# Patient Record
Sex: Male | Born: 1990 | Race: Black or African American | Hispanic: No | Marital: Single | Smoking: Current every day smoker
Health system: Southern US, Community
[De-identification: ages and names within clinical notes are randomized; demographics above are authoritative.]

## PROBLEM LIST (undated history)

## (undated) ENCOUNTER — Emergency Department (HOSPITAL_COMMUNITY): Admission: EM | Payer: Self-pay | Source: Home / Self Care

## (undated) DIAGNOSIS — E162 Hypoglycemia, unspecified: Secondary | ICD-10-CM

---

## 2012-08-28 ENCOUNTER — Encounter (HOSPITAL_COMMUNITY): Payer: Self-pay

## 2012-08-28 ENCOUNTER — Emergency Department (HOSPITAL_COMMUNITY)
Admission: EM | Admit: 2012-08-28 | Discharge: 2012-08-28 | Disposition: A | Discharge status: Home / Self Care | Payer: Self-pay | Attending: Emergency Medicine | Admitting: Emergency Medicine

## 2012-08-28 ENCOUNTER — Emergency Department (HOSPITAL_COMMUNITY): Payer: Self-pay

## 2012-08-28 DIAGNOSIS — Z8639 Personal history of other endocrine, nutritional and metabolic disease: Secondary | ICD-10-CM | POA: Insufficient documentation

## 2012-08-28 DIAGNOSIS — M65839 Other synovitis and tenosynovitis, unspecified forearm: Secondary | ICD-10-CM | POA: Insufficient documentation

## 2012-08-28 DIAGNOSIS — M778 Other enthesopathies, not elsewhere classified: Secondary | ICD-10-CM

## 2012-08-28 DIAGNOSIS — Z862 Personal history of diseases of the blood and blood-forming organs and certain disorders involving the immune mechanism: Secondary | ICD-10-CM | POA: Insufficient documentation

## 2012-08-28 HISTORY — DX: Hypoglycemia, unspecified: E16.2

## 2012-08-28 MED ORDER — NAPROXEN 500 MG PO TABS: 500.0000 mg | ORAL_TABLET | Freq: Two times a day (BID) | ORAL | Status: DC

## 2012-08-28 NOTE — ED Provider Notes (Signed)
History  This chart was scribed for Shawn Crumble, PA-C working with Juliet Rude. Rubin Payor, MD by Greggory Stallion, ED scribe. This patient was seen in room WTR5/WTR5 and the patient's care was started at 6:36 PM.  CSN: [id number] Arrival date & time 08/28/12  1734   Chief Complaint  Patient presents with  . Wrist Pain   The history is provided by the patient. No language interpreter was used.    HPI Comments: Araceli Coufal is a 22 y.o. male who presents to the Emergency Department complaining of right wrist pain with associated swelling that started last night after working. Pt states his wrist locked up on him. Pt denies injury. Pt states he has broken his right hand before. Pt denies any other associated symptoms.   Past Medical History  Diagnosis Date  . Hypoglycemia    History reviewed. No pertinent past surgical history. No family history on file. History  Substance Use Topics  . Smoking status: Never Smoker   . Smokeless tobacco: Not on file  . Alcohol Use: No    Review of Systems  Musculoskeletal: Positive for joint swelling and arthralgias.  All other systems reviewed and are negative.    Allergies  Review of patient's allergies indicates no known allergies.  Home Medications  No current outpatient prescriptions on file.  BP 116/58  Pulse 52  Temp(Src) 99.8 F (37.7 C) (Oral)  Resp 16  Wt 160 lb (72.576 kg)  SpO2 100%  Physical Exam  Nursing note and vitals reviewed. Constitutional: He is oriented to person, place, and time. He appears well-developed and well-nourished. No distress.  HENT:  Head: Normocephalic and atraumatic.  Eyes: Conjunctivae and EOM are normal.  Neck: Normal range of motion. Neck supple. No tracheal deviation present.  Cardiovascular: Normal rate.   Distal and radial pulses intact.   Pulmonary/Chest: Effort normal. No respiratory distress.  Musculoskeletal: Normal range of motion.  Normal appearing right wrist. Tender to  palpation over ulnar wrist joint. Pain with flexion and extension of wrist with ulnar deviation. Strength intact. Full ROM of all fingers.   Neurological: He is alert and oriented to person, place, and time.  Skin: Skin is warm and dry. He is not diaphoretic.  Psychiatric: He has a normal mood and affect. His behavior is normal.    ED Course  Procedures (including critical care time)  DIAGNOSTIC STUDIES: Oxygen Saturation is 100% on RA, normal by my interpretation.    COORDINATION OF CARE: 7:23 PM-Discussed treatment plan which includes wrist splint, ibuprofen, and ice with pt at bedside and pt agreed to plan. Advised pt not to lift anything about 1 pound with his hand.   Labs Reviewed - No data to display Dg Wrist Complete Right  08/28/2012   *RADIOLOGY REPORT*  Clinical Data: Right wrist pain.  RIGHT WRIST - COMPLETE 3+ VIEW  Comparison: None.  Findings: Four views of the right wrist are negative for a fracture or dislocation.   No gross soft tissue abnormality.  IMPRESSION: No acute bony abnormality.   Original Report Authenticated By: Richarda Overlie, M.D.   1. Tendonitis of wrist, right     MDM  Pt's pain consistent with tendinitis. 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 X-ray negative. Neurovascularly intact. Will try velcro splint. NSAIDs at home. Rest. Follow up as needed.   Filed Vitals:   08/28/12 1820  BP: 116/58  Pulse: 52  Temp: 99.8 F (37.7 C)  TempSrc: Oral  Resp:  16  Weight: 160 lb (72.576 kg)  SpO2: 100%      I personally performed the services described in this documentation, which was scribed in my presence. The recorded information has been reviewed and is accurate.   Lottie Mussel, PA-C 08/28/12 1933

## 2012-08-28 NOTE — ED Notes (Signed)
Pt c/o rt wrist pain after working last night, no known injury

## 2012-08-28 NOTE — ED Notes (Signed)
Pt ambulatory to exam room with steady gait. Pt arrives with family.  

## 2012-08-28 NOTE — ED Notes (Signed)
Ortho tech called for application of wrist splint.  

## 2012-08-28 NOTE — ED Notes (Signed)
Patient transported to X-ray 

## 2012-08-28 NOTE — ED Provider Notes (Signed)
Medical screening examination/treatment/procedure(s) were performed by non-physician practitioner and as supervising physician I was immediately available for consultation/collaboration.  Josejuan Hoaglin R. Lorraine Terriquez, MD 08/28/12 2157 

## 2014-04-21 ENCOUNTER — Encounter (HOSPITAL_COMMUNITY): Payer: Self-pay | Admitting: Emergency Medicine

## 2014-04-21 ENCOUNTER — Emergency Department (HOSPITAL_COMMUNITY)
Admission: EM | Admit: 2014-04-21 | Discharge: 2014-04-21 | Disposition: A | Discharge status: Home / Self Care | Payer: Self-pay | Attending: Emergency Medicine | Admitting: Emergency Medicine

## 2014-04-21 DIAGNOSIS — Y929 Unspecified place or not applicable: Secondary | ICD-10-CM | POA: Insufficient documentation

## 2014-04-21 DIAGNOSIS — Y998 Other external cause status: Secondary | ICD-10-CM | POA: Insufficient documentation

## 2014-04-21 DIAGNOSIS — Z8639 Personal history of other endocrine, nutritional and metabolic disease: Secondary | ICD-10-CM | POA: Insufficient documentation

## 2014-04-21 DIAGNOSIS — H1131 Conjunctival hemorrhage, right eye: Secondary | ICD-10-CM | POA: Insufficient documentation

## 2014-04-21 DIAGNOSIS — Y939 Activity, unspecified: Secondary | ICD-10-CM | POA: Insufficient documentation

## 2014-04-21 DIAGNOSIS — Z23 Encounter for immunization: Secondary | ICD-10-CM | POA: Insufficient documentation

## 2014-04-21 DIAGNOSIS — IMO0002 Reserved for concepts with insufficient information to code with codable children: Secondary | ICD-10-CM

## 2014-04-21 DIAGNOSIS — X58XXXA Exposure to other specified factors, initial encounter: Secondary | ICD-10-CM | POA: Insufficient documentation

## 2014-04-21 DIAGNOSIS — S61211A Laceration without foreign body of left index finger without damage to nail, initial encounter: Secondary | ICD-10-CM | POA: Insufficient documentation

## 2014-04-21 MED ORDER — TETANUS-DIPHTH-ACELL PERTUSSIS 5-2.5-18.5 LF-MCG/0.5 IM SUSP
0.5000 mL | Freq: Once | INTRAMUSCULAR | Status: AC
  Administered 2014-04-21: 0.5 mL via INTRAMUSCULAR
  Filled 2014-04-21: qty 0.5

## 2014-04-21 NOTE — Discharge Instructions (Signed)
Wash the area 7 water once a day, apply a thin curative antibiotic ointment that she can purchase over-the-counter and cover with a Band-Aid until healed.  Even given a referral to ophthalmology if he develop any blurry vision at this time.  You have a subconjunctival hemorrhage which is more concerning cosmetically then injury to the eye
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
## 2015-08-22 ENCOUNTER — Emergency Department (HOSPITAL_COMMUNITY): Payer: Self-pay

## 2015-08-22 ENCOUNTER — Encounter (HOSPITAL_COMMUNITY): Payer: Self-pay | Admitting: Emergency Medicine

## 2015-08-22 ENCOUNTER — Observation Stay (HOSPITAL_COMMUNITY)
Admission: EM | Admit: 2015-08-22 | Discharge: 2015-08-24 | Disposition: A | Discharge status: Home / Self Care | Payer: Self-pay | Attending: Emergency Medicine | Admitting: Emergency Medicine

## 2015-08-22 DIAGNOSIS — S066X1A Traumatic subarachnoid hemorrhage with loss of consciousness of 30 minutes or less, initial encounter: Secondary | ICD-10-CM | POA: Insufficient documentation

## 2015-08-22 DIAGNOSIS — S065X1A Traumatic subdural hemorrhage with loss of consciousness of 30 minutes or less, initial encounter: Secondary | ICD-10-CM | POA: Insufficient documentation

## 2015-08-22 DIAGNOSIS — S069XAA Unspecified intracranial injury with loss of consciousness status unknown, initial encounter: Secondary | ICD-10-CM

## 2015-08-22 DIAGNOSIS — I609 Nontraumatic subarachnoid hemorrhage, unspecified: Secondary | ICD-10-CM

## 2015-08-22 DIAGNOSIS — S0281XA Fracture of other specified skull and facial bones, right side, initial encounter for closed fracture: Secondary | ICD-10-CM | POA: Insufficient documentation

## 2015-08-22 DIAGNOSIS — S069X9A Unspecified intracranial injury with loss of consciousness of unspecified duration, initial encounter: Secondary | ICD-10-CM | POA: Diagnosis present

## 2015-08-22 DIAGNOSIS — R40241 Glasgow coma scale score 13-15, unspecified time: Secondary | ICD-10-CM | POA: Insufficient documentation

## 2015-08-22 DIAGNOSIS — S0181XA Laceration without foreign body of other part of head, initial encounter: Secondary | ICD-10-CM | POA: Insufficient documentation

## 2015-08-22 DIAGNOSIS — Z23 Encounter for immunization: Secondary | ICD-10-CM | POA: Insufficient documentation

## 2015-08-22 DIAGNOSIS — S0219XA Other fracture of base of skull, initial encounter for closed fracture: Principal | ICD-10-CM | POA: Insufficient documentation

## 2015-08-22 DIAGNOSIS — S0011XA Contusion of right eyelid and periocular area, initial encounter: Secondary | ICD-10-CM | POA: Insufficient documentation

## 2015-08-22 DIAGNOSIS — R413 Other amnesia: Secondary | ICD-10-CM | POA: Insufficient documentation

## 2015-08-22 DIAGNOSIS — S0003XA Contusion of scalp, initial encounter: Secondary | ICD-10-CM

## 2015-08-22 HISTORY — DX: Unspecified intracranial injury with loss of consciousness status unknown, initial encounter: S06.9XAA

## 2015-08-22 LAB — I-STAT CHEM 8, ED
BUN: 8 mg/dL (ref 6–20)
CALCIUM ION: 1.13 mmol/L (ref 1.13–1.30)
CHLORIDE: 106 mmol/L (ref 101–111)
CREATININE: 1 mg/dL (ref 0.61–1.24)
GLUCOSE: 83 mg/dL (ref 65–99)
HCT: 45 % (ref 39.0–52.0)
Hemoglobin: 15.3 g/dL (ref 13.0–17.0)
Potassium: 4.1 mmol/L (ref 3.5–5.1)
Sodium: 138 mmol/L (ref 135–145)
TCO2: 23 mmol/L (ref 0–100)

## 2015-08-22 LAB — RAPID URINE DRUG SCREEN, HOSP PERFORMED
Amphetamines: NOT DETECTED
BENZODIAZEPINES: NOT DETECTED
Barbiturates: NOT DETECTED
Cocaine: NOT DETECTED
OPIATES: POSITIVE — AB
Tetrahydrocannabinol: POSITIVE — AB

## 2015-08-22 LAB — CBC
HEMATOCRIT: 43 % (ref 39.0–52.0)
Hemoglobin: 14.1 g/dL (ref 13.0–17.0)
MCH: 28 pg (ref 26.0–34.0)
MCHC: 32.8 g/dL (ref 30.0–36.0)
MCV: 85.3 fL (ref 78.0–100.0)
Platelets: 143 10*3/uL — ABNORMAL LOW (ref 150–400)
RBC: 5.04 MIL/uL (ref 4.22–5.81)
RDW: 13.7 % (ref 11.5–15.5)
WBC: 9.2 10*3/uL (ref 4.0–10.5)

## 2015-08-22 LAB — URINALYSIS, ROUTINE W REFLEX MICROSCOPIC
BILIRUBIN URINE: NEGATIVE
GLUCOSE, UA: NEGATIVE mg/dL
Hgb urine dipstick: NEGATIVE
Ketones, ur: 15 mg/dL — AB
Leukocytes, UA: NEGATIVE
NITRITE: NEGATIVE
PH: 6 (ref 5.0–8.0)
Protein, ur: NEGATIVE mg/dL
SPECIFIC GRAVITY, URINE: 1.046 — AB (ref 1.005–1.030)

## 2015-08-22 LAB — COMPREHENSIVE METABOLIC PANEL
ALK PHOS: 60 U/L (ref 38–126)
ALT: 26 U/L (ref 17–63)
AST: 30 U/L (ref 15–41)
Albumin: 4.3 g/dL (ref 3.5–5.0)
Anion gap: 7 (ref 5–15)
BUN: 8 mg/dL (ref 6–20)
CHLORIDE: 107 mmol/L (ref 101–111)
CO2: 22 mmol/L (ref 22–32)
CREATININE: 1.11 mg/dL (ref 0.61–1.24)
Calcium: 9.3 mg/dL (ref 8.9–10.3)
GFR calc Af Amer: >60 mL/min (ref >60)
GFR calc non Af Amer: >60 mL/min (ref >60)
GLUCOSE: 86 mg/dL (ref 65–99)
POTASSIUM: 4.1 mmol/L (ref 3.5–5.1)
Sodium: 136 mmol/L (ref 135–145)
Total Bilirubin: 0.8 mg/dL (ref 0.3–1.2)
Total Protein: 7 g/dL (ref 6.5–8.1)

## 2015-08-22 LAB — PROTIME-INR
INR: 1.14 (ref 0.00–1.49)
PROTHROMBIN TIME: 14.8 s (ref 11.6–15.2)

## 2015-08-22 LAB — I-STAT CG4 LACTIC ACID, ED: Lactic Acid, Venous: 1.28 mmol/L (ref 0.5–1.9)

## 2015-08-22 LAB — CDS SEROLOGY

## 2015-08-22 LAB — SAMPLE TO BLOOD BANK

## 2015-08-22 LAB — ETHANOL: Alcohol, Ethyl (B): <5 mg/dL (ref <5)

## 2015-08-22 MED ORDER — TETANUS-DIPHTH-ACELL PERTUSSIS 5-2.5-18.5 LF-MCG/0.5 IM SUSP
0.5000 mL | Freq: Once | INTRAMUSCULAR | Status: AC
  Administered 2015-08-22: 0.5 mL via INTRAMUSCULAR
  Filled 2015-08-22: qty 0.5

## 2015-08-22 MED ORDER — HYDROMORPHONE HCL 1 MG/ML IJ SOLN: 0.5000 mg | INTRAMUSCULAR | Status: DC | PRN

## 2015-08-22 MED ORDER — POTASSIUM CHLORIDE IN NACL 20-0.9 MEQ/L-% IV SOLN
INTRAVENOUS | Status: DC
  Administered 2015-08-22 – 2015-08-23 (×2): via INTRAVENOUS
  Filled 2015-08-22 (×6): qty 1000

## 2015-08-22 MED ORDER — ONDANSETRON HCL 4 MG PO TABS
4.0000 mg | ORAL_TABLET | Freq: Four times a day (QID) | ORAL | Status: DC | PRN
  Administered 2015-08-23: 4 mg via ORAL
  Filled 2015-08-22: qty 1

## 2015-08-22 MED ORDER — OXYCODONE HCL 5 MG PO TABS
5.0000 mg | ORAL_TABLET | ORAL | Status: DC | PRN
  Administered 2015-08-22: 5 mg via ORAL
  Filled 2015-08-22: qty 1

## 2015-08-22 MED ORDER — MORPHINE SULFATE (PF) 4 MG/ML IV SOLN
4.0000 mg | Freq: Once | INTRAVENOUS | Status: AC
  Administered 2015-08-22: 4 mg via INTRAVENOUS
  Filled 2015-08-22: qty 1

## 2015-08-22 MED ORDER — SODIUM CHLORIDE 0.9 % IV BOLUS (SEPSIS)
500.0000 mL | Freq: Once | INTRAVENOUS | Status: AC
  Administered 2015-08-22: 500 mL via INTRAVENOUS

## 2015-08-22 MED ORDER — OXYCODONE HCL 5 MG PO TABS
10.0000 mg | ORAL_TABLET | ORAL | Status: DC | PRN
  Administered 2015-08-23 (×4): 10 mg via ORAL
  Filled 2015-08-22 (×4): qty 2

## 2015-08-22 MED ORDER — HYDROMORPHONE HCL 1 MG/ML IJ SOLN
1.0000 mg | INTRAMUSCULAR | Status: DC | PRN
Start: 2015-08-22 — End: 2015-08-24

## 2015-08-22 MED ORDER — ONDANSETRON HCL 4 MG/2ML IJ SOLN: 4.0000 mg | Freq: Four times a day (QID) | INTRAMUSCULAR | Status: DC | PRN

## 2015-08-22 MED ORDER — HYDROMORPHONE HCL 1 MG/ML IJ SOLN: 1.0000 mg | INTRAMUSCULAR | Status: DC | PRN

## 2015-08-22 MED ORDER — OXYCODONE HCL 5 MG PO TABS: 2.5000 mg | ORAL_TABLET | ORAL | Status: DC | PRN

## 2015-08-22 MED ORDER — IOPAMIDOL (ISOVUE-300) INJECTION 61%
INTRAVENOUS | Status: AC
  Administered 2015-08-22: 100 mL
  Filled 2015-08-22: qty 100

## 2015-08-22 MED ORDER — SODIUM CHLORIDE 0.9 % IV BOLUS (SEPSIS): 125.0000 mL | Freq: Once | INTRAVENOUS | Status: DC
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
## 2015-08-22 NOTE — Progress Notes (Signed)
Patient  Received from [facility] and oriented[facility]./ welcomed to unit oriented to room call light system./ will continue to monitor.
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
## 2015-08-22 NOTE — ED Notes (Signed)
Patient transported to CT 
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
## 2015-08-23 ENCOUNTER — Encounter (HOSPITAL_COMMUNITY): Payer: Self-pay | Admitting: Plastic Surgery

## 2015-08-23 LAB — BASIC METABOLIC PANEL
ANION GAP: 4 — AB (ref 5–15)
CHLORIDE: 107 mmol/L (ref 101–111)
CO2: 24 mmol/L (ref 22–32)
Calcium: 9 mg/dL (ref 8.9–10.3)
Creatinine, Ser: 0.93 mg/dL (ref 0.61–1.24)
GFR calc Af Amer: >60 mL/min (ref >60)
GLUCOSE: 102 mg/dL — AB (ref 65–99)
POTASSIUM: 3.7 mmol/L (ref 3.5–5.1)
SODIUM: 135 mmol/L (ref 135–145)

## 2015-08-23 LAB — CBC
HCT: 38.8 % — ABNORMAL LOW (ref 39.0–52.0)
HEMOGLOBIN: 12.4 g/dL — AB (ref 13.0–17.0)
MCH: 27.3 pg (ref 26.0–34.0)
MCHC: 32 g/dL (ref 30.0–36.0)
MCV: 85.3 fL (ref 78.0–100.0)
PLATELETS: 136 10*3/uL — AB (ref 150–400)
RBC: 4.55 MIL/uL (ref 4.22–5.81)
RDW: 13.7 % (ref 11.5–15.5)
WBC: 7.9 10*3/uL (ref 4.0–10.5)

## 2015-08-23 MED ORDER — OXYCODONE HCL 5 MG PO TABS: 5.0000 mg | ORAL_TABLET | ORAL | Status: DC | PRN

## 2015-08-23 NOTE — Discharge Instructions (Signed)
Traumatic Brain Injury Traumatic brain injury (TBI) is an injury to your brain from a blow to your head (closed injury) or an object penetrating your skull and entering your brain (open injury). The severity of TBI varies significantly from one person to the next. Some TBIs cause you to pass out (lose consciousness) immediately and for a long period of time. Other TBIs do not cause any loss of consciousness.  Symptoms of any type of TBI can be long lasting (chronic). TBI can interfere with memory and speech. TBI can also cause chronic symptoms like headache or dizziness. CAUSES  TBI is caused by a closed or open injury. RISK FACTORS You may be at higher risk for TBI if you:  Are 75 or older.  Are a man.  Are in a car accident.  Play a contact sport, especially football, hockey, or soccer.  Do not wear protective gear while playing sports.  Are in the [employer].  Are a victim of violence.  Abuse drugs or alcohol.  Have had a previous TBI. SIGNS AND SYMPTOMS  Signs and symptoms of TBI may occur right away or not until days, weeks, or months after the injury. They may last for days, weeks, months, or years. Symptoms may include:  Loss of consciousness.  Headache.  Confusion.  Fatigue.  Changes in sleep.  Dizziness.  Mood or personality changes.  Memory problems.  Nausea or vomiting or both.  Seizures.  Clumsiness.  Slurred speech.  Depression and anxiety.  Anger.  Inability to control emotions or actions (impulse control).  Loss of or dulling of your senses, such as hearing, vision, and touch. This can include:  Blurred vision.  Ringing in your ears. DIAGNOSIS  TBI may be diagnosed by a medical history and physical exam. Your health care provider will also do a neurologic exam to check your:  Reflexes.  Sensations.  Alertness.  Memory.  Vision.  Hearing.  Coordination. Your health care provider will also do tests to diagnose the extent of  your TBI, such as a CT scan of your brain and skull. One way to determine the severity of your TBI is with a scoring system called the Glasgow Coma Scale (GCS). It measures eye opening, motor response, and verbal response. The higher the score, the milder the TBI.  Your TBI may be described as mild, moderate, or severe:   Mild TBI (concussion).  Symptoms of mild TBI usually go away on their own. This can take weeks or months, depending on the type of concussion.  Your GCS will be 13-15.  Your brain CT scan will be normal.  You may or may not have a short hospital stay.  Moderate TBI.  Your GCS will be 9-12.  Your brain CT scan will be abnormal.  You will likely need a short hospital stay.  Severe TBI.  Your GCS will be 3-8.  Your brain CT scan will be abnormal.  You may need a long stay in the hospital. TREATMENT Emergency treatment of TBI may involve measures to maintain a clear airway and stable blood pressure. Brain surgery may be needed to:  Remove a blood clot.  Repair bleeding.  Remove an object that has penetrated the brain, such as a skull fragment or a bullet. Other treatments depend on your chronic signs and symptoms. These treatments include the following types of therapy:  Physical.  Occupational.  Speech and language.  Mental health.  Social support. HOME CARE INSTRUCTIONS  Carefully follow all your health care  provider's instructions.  Work closely with all your therapists, if necessary.   Take medicines only as directed by your health care provider. Do not take aspirin or other anti-inflammatory medicines such as ibuprofen or naproxen unless approved by your health care provider.  Do not abuse illegal drugs.   Limit alcohol intake to no more than 1 drink per day for nonpregnant women and 2 drinks per day for men. One drink equals 12 ounces of beer, 5 ounces of wine, or 1 ounces of hard liquor.  Avoid any situation where there is potential  for another head injury, such as football, hockey, soccer, basketball, martial arts, downhill snow sports, and horseback riding. Do not do these activities until your health care provider approves.   Rest. Rest helps the brain to heal. Make sure you:   Get plenty of sleep at night. Avoid staying up late at night.   Keep the same bedtime hours on weekends and weekdays.   Rest during the day. Take daytime naps or rest breaks when you feel tired.  Avoid excessive visual stimulation while recovering from a TBI. This includes work on the computer, watching TV, and reading.  Try to avoid activities that cause physical or mental stress. Stay home from work or school as directed by your health care provider.  Make lists to plan your day and help your memory.  Do not drive, ride a bicycle, or operate heavy machinery until your health care provider approves.  Seek support from friends and family.  Keep all follow-up visits as directed by your health care provider. This is important.  Watch your symptoms and tell others to do the same. Complications sometimes occur after a TBI. PREVENTION   Wear a helmet while biking, skiing, skateboarding, skating, or doing similar activities. Wear your seatbelt while driving.  Do not abuse alcohol or drugs.  Do not drink and drive.  Prevent falls at home by:  Removing clutter and tripping hazards, including loose rugs, from floors and stairways.  Using grab bars in bathrooms and handrails by stairs.   Placing nonslip mats on floors and in bathtubs.   Improving lighting in dim areas.  SEEK MEDICAL CARE IF: Seek medical care if you have any of the following symptoms for more than two weeks after your injury:  Chronic headaches.   Dizziness or balance problems.   Nausea.   Vision problems.   Increased sensitivity to noise or light.   Depression or mood swings.   Anxiety or irritability.   Memory problems.   Difficulty  concentrating or paying attention.   Sleep problems.   Feeling tired all the time.  SEEK IMMEDIATE MEDICAL CARE IF:  You have confusion or unusual drowsiness.  It is difficult to wake you up.   You have nausea or persistent, forceful vomiting.   You feel like you are moving when you are not (vertigo). Your eyes may move rapidly back and forth.  You have convulsions or faint.   You have severe, persistent headaches that are not relieved by medicine.   You cannot use your arms or legs normally.   One of your pupils is larger than the other.   You have clear or bloody discharge from your nose or ears.  Your problems are getting worse, not better.    This information is not intended to replace advice given to you by your health care provider. Make sure you discuss any questions you have with your health care provider.   Document Released:  01/21/2002 Document Revised: 02/21/2014 Document Reviewed: 05/16/2013 Elsevier Interactive Patient Education 2016 [employer] Post-Concussion Syndrome Post-concussion syndrome describes the symptoms that can occur after a head injury. These symptoms can last from weeks to months. CAUSES  It is not clear why some head injuries cause post-concussion syndrome. It can occur whether your head injury was mild or severe and whether you were wearing head protection or not.  SIGNS AND SYMPTOMS  Memory difficulties.  Dizziness.  Headaches.  Double vision or blurry vision.  Sensitivity to light.  Hearing difficulties.  Depression.  Tiredness.  Weakness.  Difficulty with concentration.  Difficulty sleeping or staying asleep.  Vomiting.  Poor balance or instability on your feet.  Slow reaction time.  Difficulty learning and remembering things you have heard. DIAGNOSIS  There is no test to determine whether you have post-concussion syndrome. Your health care provider may order an imaging scan of your brain, such as a  CT scan, to check for other problems that may be causing your symptoms (such as a severe injury inside your skull). TREATMENT  Usually, these problems disappear over time without medical care. Your health care provider may prescribe medicine to help ease your symptoms. It is important to follow up with a neurologist to evaluate your recovery and address any lingering symptoms or issues. HOME CARE INSTRUCTIONS   Take medicines only as directed by your health care provider. Do not take aspirin. Aspirin can slow blood clotting.  Sleep with your head slightly elevated to help with headaches.  Avoid any situation where there is potential for another head injury. This includes football, hockey, soccer, basketball, martial arts, downhill snow sports, and horseback riding. Your condition will get worse every time you experience a concussion. You should avoid these activities until you are evaluated by the appropriate follow-up health care providers.  Keep all follow-up visits as directed by your health care provider. This is important. SEEK MEDICAL CARE IF:  You have increased problems paying attention or concentrating.  You have increased difficulty remembering or learning new information.  You need more time to complete tasks or assignments than before.  You have increased irritability or decreased ability to cope with stress.  You have more symptoms than before. Seek medical care if you have any of the following symptoms for more than two weeks after your injury:  Lasting (chronic) headaches.  Dizziness or balance problems.  Nausea.  Vision problems.  Increased sensitivity to noise or light.  Depression or mood swings.  Anxiety or irritability.  Memory problems.  Difficulty concentrating or paying attention.  Sleep problems.  Feeling tired all the time. SEEK IMMEDIATE MEDICAL CARE IF:  You have confusion or unusual drowsiness.  Others find it difficult to wake you  up.  You have nausea or persistent, forceful vomiting.  You feel like you are moving when you are not (vertigo). Your eyes may move rapidly back and forth.  You have convulsions or faint.  You have severe, persistent headaches that are not relieved by medicine.  You cannot use your arms or legs normally.  One of your pupils is larger than the other.  You have clear or bloody discharge from your nose or ears.  Your problems are getting worse, not better. MAKE SURE YOU:  Understand these instructions.  Will watch your condition.  Will get help right away if you are not doing well or get worse.   This information is not intended to replace advice given to you by your health care  provider. Make sure you discuss any questions you have with your health care provider.   Document Released: 07/23/2001 Document Revised: 02/21/2014 Document Reviewed: 05/08/2013 [employer] Interactive Patient Education [employer]

## 2015-08-23 NOTE — Consult Note (Signed)
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 opacification of the bilateral ethmoidal air cells. The visualized mastoid air cells are unopacified. No aggressive appearing focal osseous lesions. The parapharyngeal fat planes are preserved. The nasopharynx, oropharynx and hypopharynx are unremarkable in appearance. The parotid and submandibular glands are within normal limits. No cervical lymphadenopathy is seen. CT CERVICAL SPINE FINDINGS No fracture is detected in the cervical spine. No prevertebral soft tissue swelling. There is straightening of the cervical spine, usually due to positioning and/or muscle spasm. Dens is well positioned between the lateral masses of C1. The lateral masses appear well-aligned. Cervical disc heights are preserved, with no significant spondylosis. No significant facet arthropathy. No significant cervical foraminal stenosis. No cervical spine subluxation. Visualized mastoid air cells appear clear. No gross cervical canal hematoma. No significant pulmonary nodules at the visualized lung apices. No cervical adenopathy or other significant neck soft tissue abnormality. IMPRESSION: 1. Nondisplaced right frontal calvarial/right orbital roof fracture. 2. Small acute bilateral  frontal convexity subdural hematomas. Small amount of subarachnoid hemorrhage in the bilateral frontal sulci. No intraventricular hemorrhage. No midline shift. 3. Bifrontal scalp contusions. Extraconal right orbital contusion. No intraconal hematoma. 4. No cervical spine fracture or subluxation. These results were called by  telephone at the time of interpretation on 08/22/2015 at 6:09 pm to DR [facility], who verbally acknowledged these results. Electronically Signed   By: Ilona Sorrel M.D.   On: 08/22/2015 18:17    Review of Systems  Constitutional: Negative.   HENT: Negative.   Eyes: Negative.   Respiratory: Negative.   Cardiovascular: Negative.   Gastrointestinal: Negative.   Genitourinary: Negative.   Musculoskeletal: Negative.   Skin: Negative.   Neurological: Negative.   Psychiatric/Behavioral: Negative.    Blood pressure 112/62, pulse 54, temperature 98.4 F (36.9 C), temperature source Oral, resp. rate 18, height _0  (1.88 m), weight 71.94 kg (158 lb 9.6 oz), SpO2 98 %. Physical Exam  Constitutional: He is oriented to person, place, and time. He appears well-developed and well-nourished.  HENT:  Head: Normocephalic.  Right Ear: External ear normal.  Left Ear: External ear normal.  Eyes: Conjunctivae and EOM are normal. Pupils are equal, round, and reactive to light. Right eye exhibits no discharge. Left eye exhibits no discharge.  Cardiovascular: Normal rate.   Respiratory: Effort normal. No respiratory distress.  GI: Soft. He exhibits no distension. There is no tenderness.  Neurological: He is alert and oriented to person, place, and time.  Skin: Skin is warm.  Psychiatric: He has a normal mood and affect. His behavior is normal. Judgment and thought content normal.    Assessment/Plan: Head of bed elevated as able.  Ice to right eye as able.  No nose blowing.  No surgery needed at present for facial fractures.  Wallace Going 08/23/2015, 8:44 AM

## 2015-08-23 NOTE — Progress Notes (Signed)
Pt began to vomit when moving around. md paged. Order given to cancel discharge

## 2015-08-23 NOTE — Progress Notes (Deleted)
Pt d/c to home by car with family. Assessment stable. Prescription given. All questions answered 
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
## 2015-08-24 NOTE — Progress Notes (Signed)
Pt discharged at this time with mother.  States they have discharge instructions and prescription MATCH form.  Verbalizes understanding of discharge instructions.  Has all belongings.  In no pain or distress at present.
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
## 2015-08-24 NOTE — Discharge Summary (Signed)
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 minutes   Signed:  Pia Mau PASII
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
## 2015-08-28 ENCOUNTER — Encounter (HOSPITAL_COMMUNITY): Payer: Self-pay | Admitting: Oncology

## 2015-08-28 ENCOUNTER — Emergency Department (HOSPITAL_COMMUNITY)
Admission: EM | Admit: 2015-08-28 | Discharge: 2015-08-28 | Disposition: A | Discharge status: Home / Self Care | Payer: No Typology Code available for payment source | Attending: Dermatology | Admitting: Dermatology

## 2015-08-28 DIAGNOSIS — Y9389 Activity, other specified: Secondary | ICD-10-CM | POA: Insufficient documentation

## 2015-08-28 DIAGNOSIS — Y92410 Unspecified street and highway as the place of occurrence of the external cause: Secondary | ICD-10-CM | POA: Insufficient documentation

## 2015-08-28 DIAGNOSIS — Y999 Unspecified external cause status: Secondary | ICD-10-CM | POA: Insufficient documentation

## 2015-08-28 DIAGNOSIS — Z5321 Procedure and treatment not carried out due to patient leaving prior to being seen by health care provider: Secondary | ICD-10-CM | POA: Insufficient documentation

## 2015-08-28 DIAGNOSIS — R52 Pain, unspecified: Secondary | ICD-10-CM | POA: Insufficient documentation
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
## 2015-08-28 NOTE — ED Notes (Signed)
Called for room x 3 w/o answer.

## 2015-08-28 NOTE — ED Notes (Signed)
Pt called for room no response from lobby 

## 2015-09-02 ENCOUNTER — Emergency Department (HOSPITAL_COMMUNITY): Payer: Self-pay

## 2015-09-02 ENCOUNTER — Emergency Department (HOSPITAL_COMMUNITY)
Admission: EM | Admit: 2015-09-02 | Discharge: 2015-09-02 | Disposition: A | Discharge status: Home / Self Care | Payer: Self-pay | Attending: Emergency Medicine | Admitting: Emergency Medicine

## 2015-09-02 ENCOUNTER — Encounter (HOSPITAL_COMMUNITY): Payer: Self-pay

## 2015-09-02 DIAGNOSIS — R519 Headache, unspecified: Secondary | ICD-10-CM

## 2015-09-02 DIAGNOSIS — R51 Headache: Secondary | ICD-10-CM | POA: Insufficient documentation

## 2015-09-02 DIAGNOSIS — Y999 Unspecified external cause status: Secondary | ICD-10-CM | POA: Insufficient documentation

## 2015-09-02 DIAGNOSIS — Y939 Activity, unspecified: Secondary | ICD-10-CM | POA: Insufficient documentation

## 2015-09-02 DIAGNOSIS — Y92410 Unspecified street and highway as the place of occurrence of the external cause: Secondary | ICD-10-CM | POA: Insufficient documentation

## 2015-09-02 DIAGNOSIS — F17200 Nicotine dependence, unspecified, uncomplicated: Secondary | ICD-10-CM | POA: Insufficient documentation

## 2015-09-02 DIAGNOSIS — Z87820 Personal history of traumatic brain injury: Secondary | ICD-10-CM | POA: Insufficient documentation

## 2015-09-02 DIAGNOSIS — M545 Low back pain: Secondary | ICD-10-CM | POA: Insufficient documentation

## 2015-09-02 DIAGNOSIS — M549 Dorsalgia, unspecified: Secondary | ICD-10-CM

## 2015-09-02 MED ORDER — OXYCODONE HCL 5 MG PO TABS: 5.0000 mg | ORAL_TABLET | ORAL | Status: DC | PRN

## 2015-09-02 MED ORDER — OXYCODONE-ACETAMINOPHEN 5-325 MG PO TABS
1.0000 | ORAL_TABLET | Freq: Once | ORAL | Status: AC
  Administered 2015-09-02: 1 via ORAL
  Filled 2015-09-02: qty 1

## 2015-09-02 MED ORDER — CYCLOBENZAPRINE HCL 10 MG PO TABS
10.0000 mg | ORAL_TABLET | Freq: Once | ORAL | Status: AC
  Administered 2015-09-02: 10 mg via ORAL
  Filled 2015-09-02: qty 1

## 2015-09-02 MED ORDER — CYCLOBENZAPRINE HCL 10 MG PO TABS: 10.0000 mg | ORAL_TABLET | Freq: Three times a day (TID) | ORAL | Status: DC | PRN

## 2015-09-02 NOTE — ED Notes (Signed)
Patient transported to CT 

## 2015-09-02 NOTE — ED Notes (Signed)
MD at bedside. 

## 2015-09-02 NOTE — Discharge Instructions (Signed)
Read the information below.  Use the prescribed medication as directed.  Please discuss all new medications with your pharmacist.  You may return to the Emergency Department at any time for worsening condition or any new symptoms that concern you.     If you develop fevers, loss of control of bowel or bladder, weakness or numbness in your legs, or are unable to walk, return to the ER for a recheck.    AllstateCommunity Resource Guide Financial Assistance The United Ways 211 is a great source of information about community services available.  Access by dialing 2-1-1 from anywhere in [place], or by website -  PooledIncome.plwww.nc211.org.   Other Local Resources (Updated 02/2015)  Financial Assistance   Services    Phone Number and Address  [facility]  Low-cost medical care - 1st and 3rd Saturday of every month  Must not qualify for public or private insurance and must have limited income [phone] [address] [place], [place]    [place] Department of Social Services  Child care  Emergency assistance for housing and Kimberly-Clarkutilities  Food stamps  Medicaid [id number] [address] [place], [place] [address]   [facility]  Low-cost medical care for children, communicable diseases, sexually-transmitted diseases, immunizations, maternity care, womens health and family planning [phone] [address] [place], [place] [address]  [facility]   Medication assistance for [facility] [place] [place] residents  Must meet income requirements [phone] 6242 [address] [place], [place].    [facility] Social [facility]  Child care  Emergency assistance for housing and Kimberly-Clarkutilities  Food stamps  Medicaid [phone] [address] [place], [place] [address]  [facility]   Low-cost medical care,   Monday through Friday, 9 am to 6 pm.   Accepts  Medicare/Medicaid, and self-pay [phone] [address] [place], [place] [address]  [facility]  Low-cost medical care - Monday through Friday, 8:30 am - 5:30 pm  Accepts Medicaid and self-pay [phone] [address] [place], [place] [address]   [facility]  Primary medical care, including for those with sickle cell disease  Accepts Medicare, Medicaid, insurance and self-pay [phone] [address] [place], [place]  [facility]   Primary medical care  Accepts Medicare, [place], insurance and self-pay [phone] [address], [address] [place], [place] [address]   [facility] Department of Social Services  Child care  Emergency assistance for housing and Kimberly-Clarkutilities  Food stamps  Medicaid [phone] [address] [place], [place] [address]  [facility] Department of Health and CarMaxHuman Services  Child care  Emergency assistance for housing and Kimberly-Clarkutilities  Food stamps  Medicaid [phone] [address] [place], [place] [address]   [facility]  Medication assistance for [facility] residents with no insurance only  Must have a primary care doctor [phone] [address] [place], [place]  [facility]   Primary medical care  [place] Medicare, [place], insurance  [phone] [address] [place], [place]  [facility]ist   Medication assistance [phone]  [facility]   Primary medical care  Accepts Medicare, [place], insurance and self-pay [phone] [address] [place], [place] [address]  [facility]   Primary medical care  Accepts Medicare, [place], insurance and self-pay [phone] [address] [place], [place] [address]  [facility]  For [facility] residents between the ages of 54 and 69 who do not have any  form of health insurance, Medicare, [place], or [place] benefits.  Services are provided free of charge to uninsured patients who fall within federal poverty guidelines.    Hours: Tuesdays and Thursdays, 4:15 - 8 pm [phone] [address] [place], [place] [address]  [facility]     Primary medical care  Dental care  Nutritional counseling  Pharmacy  Accepts Medicaid, Medicare, most insurance.  Fees are adjusted based on ability to pay.   [phone] [facility] [address] [place], [place]  [phone] [facility] [address] [place], [place]  [phone] [facility] [place], [place]  [phone] [facility], [address] [place], [place]  [phone] [facility] [address] [place], [place]  [facility]  Womens health and family planning [phone] [address] [place], [place]  [facility] Department of Social Services  Child care  Emergency assistance for housing and Kimberly-Clark  Medicaid [phone] [address] [place], [place] [address]   [facility]    Ages 32 and older  Hours: Mondays and Thursdays, 7:00 am - 9:00 am Patients are seen on a first come, first served basis. [phone], ext. [phone] [place], [place]  [facility] Division of Social Services  Child care  Emergency assistance for housing and Kimberly-Clark  Medicaid [phone] [address] [place], [place] [address]  [facility]  Medication assistance  Rental assistance  Food pantry  Medication assistance  Housing assistance  Emergency food distribution  Utility assistance [phone] [address] [place], [place]  [phone]  [address] [place], [place] [address] Hours: Tuesdays and Thursdays from 9am - 12  noon by appointment only  [phone] [address] [place], [place] [address]  [facility] - Lanae Boast   Accepts private insurance, [place], and [place].  Payment is based on a sliding scale for those without insurance.  Hours: Mondays, Tuesdays and Thursdays, 8:30 am - 5:30 pm.   [phone] [address] [place], [place]  [facility] - Family Medicine at [facility], [place], and [place].  Payment is based on a sliding scale for those without insurance. [phone] [address] [place], [place]  [facility] - Pediatrics at E. Scientist, research (physical sciences), [employer], and [place].  Payment is based on a sliding scale for those without insurance [phone] [address], [employer], [place]  [facility] - Pediatrics at [employer], [place], and [place].  Payment is based on a sliding scale for those without insurance. [phone] [address] [place], [place]  [facility] - Pediatrics at [facility], [place], and [place].  Payment is based on a sliding scale for those without insurance. [phone], [phone] [place], [place].    [facility]  Maternity care.  Accepts Medicaid and self-pay. [phone] [address] [place], [place]
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
## 2015-09-02 NOTE — ED Notes (Signed)
Family at bedside. 
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
## 2015-09-02 NOTE — Progress Notes (Addendum)
ED Cm noted pt with a Cm consult for pcp services  [facility] secretary to get fax number to provide list of uninsured pcps and resources Cm spoke with [phone] who scheduled pt for Monday 10/12/15 at 0930 for f/u appt   Entered in d/c instructions  Massie MaroonLachina M Hollis, FNP Go on 10/12/2015 You have been scheduled to establish care with L Hollis at 0930 10/12/15 Please call if you can not make the appt [address] [place] [address] [phone]  Faxed resources to ATTN Mandy/Mildred x [phone] to give to pt   Provided written information to assist pt with determining choice for uninsured accepting pcps, discussed the importance of pcp vs EDP services for f/u care, www.needymeds.org, www.goodrx.com, discounted pharmacies and other [place] resources such as [facility] , [facility], affordable care act, financial assistance, uninsured dental services, [place] med assist, DSS and  health department  Provide resources for [place] uninsured accepting pcps like [facility], family medicine at [address], [facility], [facility], local urgent care centers, [facility], [facility] family [facility], general medical clinics, family services of the [place], [facility] urgent care plus others, medication resources, [facility] out patient pharmacies and housing

## 2015-09-02 NOTE — ED Notes (Signed)
Pt states he understands instructions. Mother at bedside and agrees.

## 2015-10-12 ENCOUNTER — Ambulatory Visit: Payer: Self-pay | Admitting: Family Medicine

## 2015-11-30 ENCOUNTER — Ambulatory Visit (INDEPENDENT_AMBULATORY_CARE_PROVIDER_SITE_OTHER): Payer: Self-pay | Admitting: Family Medicine

## 2015-11-30 ENCOUNTER — Encounter: Payer: Self-pay | Admitting: Family Medicine

## 2015-11-30 VITALS — BP 136/78 | HR 65 | Temp 98.4°F | Resp 16 | Ht 74.0 in | Wt 152.0 lb

## 2015-11-30 DIAGNOSIS — Z0000 Encounter for general adult medical examination without abnormal findings: Secondary | ICD-10-CM

## 2015-11-30 DIAGNOSIS — S069X9A Unspecified intracranial injury with loss of consciousness of unspecified duration, initial encounter: Secondary | ICD-10-CM

## 2015-11-30 LAB — CBC WITH DIFFERENTIAL/PLATELET
BASOS ABS: 0 {cells}/uL (ref 0–200)
BASOS PCT: 0 %
EOS ABS: 58 {cells}/uL (ref 15–500)
Eosinophils Relative: 1 %
HCT: 43.1 % (ref 38.5–50.0)
Hemoglobin: 13.9 g/dL (ref 13.2–17.1)
LYMPHS PCT: 23 %
Lymphs Abs: 1334 cells/uL (ref 850–3900)
MCH: 27.6 pg (ref 27.0–33.0)
MCHC: 32.3 g/dL (ref 32.0–36.0)
MCV: 85.7 fL (ref 80.0–100.0)
MONOS PCT: 9 %
MPV: 11.6 fL (ref 7.5–12.5)
Monocytes Absolute: 522 cells/uL (ref 200–950)
Neutro Abs: 3886 cells/uL (ref 1500–7800)
Neutrophils Relative %: 67 %
PLATELETS: 156 10*3/uL (ref 140–400)
RBC: 5.03 MIL/uL (ref 4.20–5.80)
RDW: 14.9 % (ref 11.0–15.0)
WBC: 5.8 10*3/uL (ref 3.8–10.8)

## 2015-11-30 LAB — COMPLETE METABOLIC PANEL WITH GFR
ALBUMIN: 4.4 g/dL (ref 3.6–5.1)
ALK PHOS: 62 U/L (ref 40–115)
ALT: 17 U/L (ref 9–46)
AST: 25 U/L (ref 10–40)
BILIRUBIN TOTAL: 0.6 mg/dL (ref 0.2–1.2)
BUN: 8 mg/dL (ref 7–25)
CALCIUM: 9.7 mg/dL (ref 8.6–10.3)
CO2: 24 mmol/L (ref 20–31)
CREATININE: 1.03 mg/dL (ref 0.60–1.35)
Chloride: 102 mmol/L (ref 98–110)
GFR, Est Non African American: >89 mL/min (ref >=60)
Glucose, Bld: 88 mg/dL (ref 65–99)
Potassium: 4.1 mmol/L (ref 3.5–5.3)
Sodium: 136 mmol/L (ref 135–146)
TOTAL PROTEIN: 7.7 g/dL (ref 6.1–8.1)

## 2015-11-30 LAB — HEMOGLOBIN A1C
Hgb A1c MFr Bld: 5.3 % (ref <5.7)
Mean Plasma Glucose: 105 mg/dL
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
## 2015-11-30 NOTE — Patient Instructions (Signed)
Follow-up in ED for any symptoms that could be related to head injur6y.

## 2015-12-17 ENCOUNTER — Other Ambulatory Visit: Payer: Self-pay | Admitting: Family Medicine

## 2015-12-17 DIAGNOSIS — S025XXA Fracture of tooth (traumatic), initial encounter for closed fracture: Secondary | ICD-10-CM

## 2016-04-25 ENCOUNTER — Emergency Department (HOSPITAL_COMMUNITY): Payer: No Typology Code available for payment source

## 2016-04-25 ENCOUNTER — Emergency Department (HOSPITAL_COMMUNITY)
Admission: EM | Admit: 2016-04-25 | Discharge: 2016-04-25 | Disposition: A | Discharge status: Home / Self Care | Payer: No Typology Code available for payment source | Attending: Emergency Medicine | Admitting: Emergency Medicine

## 2016-04-25 ENCOUNTER — Encounter (HOSPITAL_COMMUNITY): Payer: Self-pay | Admitting: Emergency Medicine

## 2016-04-25 DIAGNOSIS — T148XXA Other injury of unspecified body region, initial encounter: Secondary | ICD-10-CM

## 2016-04-25 DIAGNOSIS — S8991XA Unspecified injury of right lower leg, initial encounter: Secondary | ICD-10-CM | POA: Diagnosis present

## 2016-04-25 DIAGNOSIS — F17200 Nicotine dependence, unspecified, uncomplicated: Secondary | ICD-10-CM | POA: Insufficient documentation

## 2016-04-25 DIAGNOSIS — Y999 Unspecified external cause status: Secondary | ICD-10-CM | POA: Insufficient documentation

## 2016-04-25 DIAGNOSIS — Y92410 Unspecified street and highway as the place of occurrence of the external cause: Secondary | ICD-10-CM | POA: Insufficient documentation

## 2016-04-25 DIAGNOSIS — Y939 Activity, unspecified: Secondary | ICD-10-CM | POA: Diagnosis not present

## 2016-04-25 DIAGNOSIS — S8001XA Contusion of right knee, initial encounter: Secondary | ICD-10-CM | POA: Diagnosis not present

## 2016-04-25 DIAGNOSIS — S80212A Abrasion, left knee, initial encounter: Secondary | ICD-10-CM | POA: Diagnosis not present
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
## 2016-04-25 NOTE — Discharge Instructions (Signed)
Your exam and x-rays were very reassuring. He may take ibuprofen 600 mg every 6 hours as needed for discomfort. Follow-up with your doctor as needed. Return to ED for any new or worsening symptoms as we discussed.
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
## 2016-05-30 ENCOUNTER — Ambulatory Visit: Payer: Self-pay | Admitting: Family Medicine

## 2016-06-16 ENCOUNTER — Ambulatory Visit: Payer: Self-pay | Admitting: Family Medicine

## 2016-12-30 ENCOUNTER — Ambulatory Visit: Payer: Self-pay | Admitting: Family Medicine

## 2017-03-08 ENCOUNTER — Encounter: Payer: Self-pay | Admitting: Family Medicine

## 2017-03-08 ENCOUNTER — Ambulatory Visit (INDEPENDENT_AMBULATORY_CARE_PROVIDER_SITE_OTHER): Payer: Self-pay | Admitting: Family Medicine

## 2017-03-08 VITALS — BP 130/64 | HR 73 | Temp 98.2°F | Resp 16 | Ht 74.0 in | Wt 164.0 lb

## 2017-03-08 DIAGNOSIS — Z131 Encounter for screening for diabetes mellitus: Secondary | ICD-10-CM

## 2017-03-08 DIAGNOSIS — K0889 Other specified disorders of teeth and supporting structures: Secondary | ICD-10-CM

## 2017-03-08 LAB — POCT GLYCOSYLATED HEMOGLOBIN (HGB A1C): Hemoglobin A1C: 5.6

## 2017-03-08 MED ORDER — NAPROXEN 500 MG PO TABS: 500.0000 mg | ORAL_TABLET | Freq: Two times a day (BID) | ORAL | 0 refills | Status: DC

## 2017-03-08 MED ORDER — AMOXICILLIN 875 MG PO TABS: 1750.0000 mg | ORAL_TABLET | Freq: Two times a day (BID) | ORAL | 0 refills | Status: DC
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
## 2017-03-08 NOTE — Patient Instructions (Addendum)
Contact [facility] and see if they are will to see you on a self pay basis, [phone].  [facility] self pay [phone]     Dental Pain Dental pain may be caused by many things, including:  Tooth decay (cavities or caries). Cavities cause the nerve of your tooth to be open to air and hot or cold temperatures. This can cause pain or discomfort.  Abscess or infection. A dental abscess is an area that is full of infected pus from a bacterial infection in the inner part of the tooth (pulp). It usually happens at the end of the tooth's root.  Injury.  An unknown reason (idiopathic).  Your pain may be mild or severe. It may only happen when:  You are chewing.  You are exposed to hot or cold temperature.  You are eating or drinking sugary foods or beverages, such as: ? Soda. ? Candy.  Your pain may also be there all of the time. Follow these instructions at home: Watch your dental pain for any changes. Do these things to lessen your discomfort:  Take medicines only as told by your dentist.  If your dentist tells you to take an antibiotic medicine, finish all of it even if you start to feel better.  Keep all follow-up visits as told by your dentist. This is important.  Do not apply heat to the outside of your face.  Rinse your mouth or gargle with salt water if told by your dentist. This helps with pain and swelling. ? You can make salt water by adding  tsp of salt to 1 cup of warm water.  Apply ice to the painful area of your face: ? Put ice in a plastic bag. ? Place a towel between your skin and the bag. ? Leave the ice on for 20 minutes, 2-3 times per day.  Avoid foods or drinks that cause you pain, such as: ? Very hot or very cold foods or drinks. ? Sweet or sugary foods or drinks.  Contact a doctor if:  Your pain is not helped with medicines.  Your symptoms are worse.  You have new symptoms. Get help right away if:  You cannot  open your mouth.  You are having trouble breathing or swallowing.  You have a fever.  Your face, neck, or jaw is puffy (swollen). This information is not intended to replace advice given to you by your health care provider. Make sure you discuss any questions you have with your health care provider. Document Released: 07/20/2007 Document Revised: 07/09/2015 Document Reviewed: 01/27/2014 Elsevier Interactive Patient Education  [employer]2018 [employer]
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
## 2020-02-23 ENCOUNTER — Emergency Department (HOSPITAL_COMMUNITY)
Admission: EM | Admit: 2020-02-23 | Discharge: 2020-02-23 | Discharge status: Against Medical Advice | Payer: Self-pay | Attending: Emergency Medicine | Admitting: Emergency Medicine

## 2020-02-23 ENCOUNTER — Other Ambulatory Visit: Payer: Self-pay

## 2020-02-23 NOTE — ED Triage Notes (Signed)
Pt presents to ED POv. Pt c/o couhg, weakness, fever. Pt denies any covid exposure. Pt is partially vaccinated for covid.
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
## 2020-02-27 ENCOUNTER — Encounter (HOSPITAL_COMMUNITY): Payer: Self-pay

## 2020-02-27 ENCOUNTER — Ambulatory Visit (HOSPITAL_COMMUNITY)
Admission: EM | Admit: 2020-02-27 | Discharge: 2020-02-27 | Disposition: A | Discharge status: Home / Self Care | Payer: HRSA Program | Attending: Urgent Care | Admitting: Urgent Care

## 2020-02-27 ENCOUNTER — Other Ambulatory Visit: Payer: Self-pay

## 2020-02-27 DIAGNOSIS — J988 Other specified respiratory disorders: Secondary | ICD-10-CM

## 2020-02-27 DIAGNOSIS — R0602 Shortness of breath: Secondary | ICD-10-CM | POA: Insufficient documentation

## 2020-02-27 DIAGNOSIS — Z20822 Contact with and (suspected) exposure to covid-19: Secondary | ICD-10-CM | POA: Insufficient documentation

## 2020-02-27 DIAGNOSIS — F17210 Nicotine dependence, cigarettes, uncomplicated: Secondary | ICD-10-CM | POA: Diagnosis not present

## 2020-02-27 DIAGNOSIS — R110 Nausea: Secondary | ICD-10-CM | POA: Diagnosis not present

## 2020-02-27 DIAGNOSIS — B349 Viral infection, unspecified: Secondary | ICD-10-CM | POA: Insufficient documentation

## 2020-02-27 DIAGNOSIS — F17200 Nicotine dependence, unspecified, uncomplicated: Secondary | ICD-10-CM

## 2020-02-27 DIAGNOSIS — R079 Chest pain, unspecified: Secondary | ICD-10-CM | POA: Insufficient documentation

## 2020-02-27 DIAGNOSIS — R5383 Other fatigue: Secondary | ICD-10-CM | POA: Diagnosis not present

## 2020-02-27 DIAGNOSIS — B9789 Other viral agents as the cause of diseases classified elsewhere: Secondary | ICD-10-CM

## 2020-02-27 DIAGNOSIS — R6883 Chills (without fever): Secondary | ICD-10-CM | POA: Insufficient documentation

## 2020-02-27 DIAGNOSIS — R059 Cough, unspecified: Secondary | ICD-10-CM | POA: Diagnosis not present

## 2020-02-27 LAB — SARS CORONAVIRUS 2 (TAT 6-24 HRS): SARS Coronavirus 2: NEGATIVE

## 2020-02-27 MED ORDER — PROMETHAZINE-DM 6.25-15 MG/5ML PO SYRP: 5.0000 mL | ORAL_SOLUTION | Freq: Every evening | ORAL | 0 refills | Status: DC | PRN

## 2020-02-27 MED ORDER — BENZONATATE 100 MG PO CAPS
100.0000 mg | ORAL_CAPSULE | Freq: Three times a day (TID) | ORAL | 0 refills | Status: DC | PRN
Start: 2020-02-27 — End: 2021-07-26

## 2020-02-27 MED ORDER — ALBUTEROL SULFATE HFA 108 (90 BASE) MCG/ACT IN AERS: 1.0000 | INHALATION_SPRAY | Freq: Four times a day (QID) | RESPIRATORY_TRACT | 0 refills | Status: AC | PRN

## 2020-02-27 MED ORDER — PREDNISONE 20 MG PO TABS: ORAL_TABLET | ORAL | 0 refills | Status: DC

## 2020-02-27 NOTE — ED Provider Notes (Signed)
  [facility]   MRN: [id number] DOB: 02/10/1991  Subjective:   Shawn Wells is a 30 y.o. male presenting for 6-day history of acute onset dry hacking cough, shortness of breath, chest pain, nausea, body aches, chills, fatigue.  Patient is not COVID vaccinated, had only one shot and did not go back for the second.  He is also a daily smoker, marijuana and also black and milds.  Denies taking chronic medications.  Allergies  Allergen Reactions  . Nickel Itching    Past Medical History:  Diagnosis Date  . Hypoglycemia      History reviewed. No pertinent surgical history.  Family History  Problem Relation Age of Onset  . Other Mother        no health conditions   . Other Father        no health conditions    Social History   Tobacco Use  . Smoking status: Current Some Day Smoker    Packs/day: 0.25  . Smokeless tobacco: Never Used  Substance Use Topics  . Alcohol use: Yes    Comment: occasionally  . Drug use: No    ROS   Objective:   Vitals: BP 130/77 (BP Location: Right Arm)   Pulse (!) 57   Temp 98.8 F (37.1 C) (Oral)   Resp 20   SpO2 99%   Physical Exam Constitutional:      General: He is not in acute distress.    Appearance: Normal appearance. He is well-developed. He is not ill-appearing, toxic-appearing or diaphoretic.  HENT:     Head: Normocephalic and atraumatic.     Right Ear: External ear normal.     Left Ear: External ear normal.     Nose: Nose normal.     Mouth/Throat:     Mouth: Mucous membranes are moist.     Pharynx: Oropharynx is clear.  Eyes:     General: No scleral icterus.    Extraocular Movements: Extraocular movements intact.     Pupils: Pupils are equal, round, and reactive to light.  Cardiovascular:     Rate and Rhythm: Normal rate and regular rhythm.     Heart sounds: Normal heart sounds. No murmur heard. No friction rub. No gallop.   Pulmonary:     Effort: Pulmonary effort is normal. No respiratory  distress.     Breath sounds: Normal breath sounds. No stridor. No wheezing, rhonchi or rales.  Neurological:     Mental Status: He is alert and oriented to person, place, and time.  Psychiatric:        Mood and Affect: Mood normal.        Behavior: Behavior normal.        Thought Content: Thought content normal.      Assessment and Plan :   PDMP not reviewed this encounter.  1. Viral respiratory infection   2. Cough   3. Smoker     COVID-19 testing pending.  In light of his smoking, chest symptoms will recommend prednisone course.  Use supportive care otherwise. Counseled patient on potential for adverse effects with medications prescribed/recommended today, ER and return-to-clinic precautions discussed, patient verbalized understanding.    Wallis Bamberg, [place] 02/27/20 [phone]

## 2020-02-27 NOTE — Discharge Instructions (Addendum)

## 2020-02-27 NOTE — ED Triage Notes (Signed)
Pt presents with non productive cough, nausea, generalized body aches, and chills X 6 days.

## 2020-04-01 ENCOUNTER — Other Ambulatory Visit: Payer: Self-pay

## 2020-04-01 ENCOUNTER — Ambulatory Visit (HOSPITAL_COMMUNITY)
Admission: EM | Admit: 2020-04-01 | Discharge: 2020-04-01 | Disposition: A | Discharge status: Home / Self Care | Payer: Self-pay | Attending: Family Medicine | Admitting: Family Medicine

## 2020-04-01 DIAGNOSIS — J029 Acute pharyngitis, unspecified: Secondary | ICD-10-CM | POA: Insufficient documentation

## 2020-04-01 LAB — POCT RAPID STREP A, ED / UC: Streptococcus, Group A Screen (Direct): NEGATIVE

## 2020-04-01 MED ORDER — IBUPROFEN 800 MG PO TABS: 800.0000 mg | ORAL_TABLET | Freq: Three times a day (TID) | ORAL | 0 refills | Status: DC

## 2020-04-01 NOTE — ED Triage Notes (Signed)
Patient presents with sore throat, chills, body aches since yesterday.  Also reports stomach pain x 1 month, resolved yesterday.   No fever at home, took tylenol 3 days ago with some relief.

## 2020-04-01 NOTE — ED Provider Notes (Signed)
[facility]   [id number] 04/01/20 Arrival Time: 1638  ASSESSMENT & PLAN:  1. Sore throat     No signs of peritonsillar abscess. Discussed.  Meds ordered this encounter  Medications  . ibuprofen (ADVIL) 800 MG tablet    Sig: Take 1 tablet (800 mg total) by mouth 3 (three) times daily with meals.    Dispense:  21 tablet    Refill:  0    Results for orders placed or performed during the hospital encounter of 04/01/20  POCT Rapid Strep A  Result Value Ref Range   Streptococcus, Group A Screen (Direct) NEGATIVE NEGATIVE   Labs Reviewed  CULTURE, GROUP A STREP [facility])  POCT RAPID STREP A, ED / UC    OTC analgesics and throat care as needed   Discharge Instructions      You may use over the counter ibuprofen or acetaminophen as needed.   For a sore throat, over the counter products such as [employer]gate Peroxyl Mouth Sore Rinse or Chloraseptic Sore Throat Spray may provide some temporary relief.  Your rapid strep test was negative today. We have sent your throat swab for culture and will let you know of any positive results.    Reviewed expectations re: course of current medical issues. Questions answered. Outlined signs and symptoms indicating need for more acute intervention. Patient verbalized understanding. After Visit Summary given.   SUBJECTIVE:  Shawn Wells is a 30 y.o. male who reports a sore throat. Describes as sharp pain with swallowing. Onset gradual beginning few d ago. Symptoms have gradually worsened since beginning; without voice changes. No respiratory symptoms. Normal PO intake but reports discomfort with swallowing. No specific alleviating factors. Fever: absent. No neck pain or swelling. No associated nausea, vomiting, or abdominal pain. Known sick contacts: none. Recent travel: none. OTC treatment: Tylenol with some relief.   OBJECTIVE:  Vitals:   04/01/20 1716  BP: 120/75  Pulse: 72  Resp: 18  Temp: 99.9 F (37.7 C)  TempSrc:  Oral  SpO2: 99%     General appearance: alert; no distress HEENT: throat with moderate erythema; no significant tonsillar hypertrophy; uvula is midline Neck: supple with FROM; no lymphadenopathy Lungs: speaks full sentences without difficulty; unlabored Abd: soft; non-tender Skin: reveals no rash; warm and dry Psychological: alert and cooperative; normal mood and affect  Allergies  Allergen Reactions  . Nickel Itching    Past Medical History:  Diagnosis Date  . Hypoglycemia    Social History   Socioeconomic History  . Marital status: Single    Spouse name: Not on file  . Number of children: Not on file  . Years of education: Not on file  . Highest education level: Not on file  Occupational History  . Not on file  Tobacco Use  . Smoking status: Current Some Day Smoker    Packs/day: 0.25  . Smokeless tobacco: Never Used  Substance and Sexual Activity  . Alcohol use: Yes    Comment: occasionally  . Drug use: No  . Sexual activity: Not on file  Other Topics Concern  . Not on file  Social History Narrative  . Not on file   Social Determinants of Health   Financial Resource Strain: Not on file  Food Insecurity: Not on file  Transportation Needs: Not on file  Physical Activity: Not on file  Stress: Not on file  Social Connections: Not on file  Intimate Partner Violence: Not on file   Family History  Problem Relation Age of  Onset  . Other Mother        no health conditions   . Other Father        no health conditions          Mardella Layman, MD 04/01/20 [phone]

## 2020-04-01 NOTE — Discharge Instructions (Addendum)
You may use over the counter ibuprofen or acetaminophen as needed.  For a sore throat, over the counter products such as Colgate Peroxyl Mouth Sore Rinse or Chloraseptic Sore Throat Spray may provide some temporary relief. Your rapid strep test was negative today. We have sent your throat swab for culture and will let you know of any positive results. 

## 2020-04-02 LAB — CULTURE, GROUP A STREP (THRC)

## 2020-04-04 LAB — CULTURE, GROUP A STREP (THRC)

## 2020-09-23 ENCOUNTER — Encounter (HOSPITAL_COMMUNITY): Payer: Self-pay

## 2020-09-23 ENCOUNTER — Ambulatory Visit (HOSPITAL_COMMUNITY)
Admission: EM | Admit: 2020-09-23 | Discharge: 2020-09-23 | Disposition: A | Discharge status: Home / Self Care | Payer: BC Managed Care – PPO | Attending: Emergency Medicine | Admitting: Emergency Medicine

## 2020-09-23 ENCOUNTER — Other Ambulatory Visit: Payer: Self-pay

## 2020-09-23 DIAGNOSIS — S46912A Strain of unspecified muscle, fascia and tendon at shoulder and upper arm level, left arm, initial encounter: Secondary | ICD-10-CM

## 2020-09-23 MED ORDER — IBUPROFEN 800 MG PO TABS: 800.0000 mg | ORAL_TABLET | Freq: Three times a day (TID) | ORAL | 0 refills | Status: DC

## 2020-09-23 NOTE — Discharge Instructions (Addendum)
Take the Ibuprofen as needed for pain.  You can also take Tylenol as needed for pain.   Rest as much as possible Ice for 10-15 minutes every 4-6 hours as needed for pain and swelling Elevate above your hip/heart when sitting and laying down  You can also try Icy/Hot, lidocaine patches, and Biofreeze for comfort.   Follow up with sports medicine or orthopedics if symptoms do not improve in the next few days.

## 2020-09-23 NOTE — ED Provider Notes (Signed)
[facility]    CSN: [id number] Arrival date & time: 09/23/20  1050      History   Chief Complaint Chief Complaint  Patient presents with   Arm Pain    HPI Shawn Wells is a 30 y.o. male.   Patient here for evaluation of left shoulder pain that has been ongoing for the past several days.  Reports similar pain in the past but never this pain and it has always resolved on its own.  Reports pain is constant and achy, but worse with movement.  Reports decreased ROM due to pain.  Has not tried any OTC medications or treatments.  Denies any trauma, injury, or other precipitating event.  Denies any fevers, chest pain, shortness of breath, N/V/D, numbness, tingling, weakness, abdominal pain, or headaches.    The history is provided by the patient.  Arm Pain   Past Medical History:  Diagnosis Date   Hypoglycemia     Patient Active Problem List   Diagnosis Date Noted   TBI (traumatic brain injury) (HCC) 08/22/2015    History reviewed. No pertinent surgical history.     Home Medications    Prior to Admission medications   Medication Sig Start Date End Date Taking? Authorizing Provider  ibuprofen (ADVIL) 800 MG tablet Take 1 tablet (800 mg total) by mouth 3 (three) times daily. 09/23/20  Yes Ivette Loyal, NP  albuterol (VENTOLIN HFA) 108 (90 Base) MCG/ACT inhaler Inhale 1-2 puffs into the lungs every 6 (six) hours as needed for wheezing or shortness of breath. 02/27/20   Wallis Bamberg, PA-C  benzonatate (TESSALON) 100 MG capsule Take 1-2 capsules (100-200 mg total) by mouth 3 (three) times daily as needed for cough. 02/27/20   Wallis Bamberg, PA-C  predniSONE (DELTASONE) 20 MG tablet Take 2 tablets daily with breakfast. 02/27/20   Wallis Bamberg, PA-C  promethazine-dextromethorphan (PROMETHAZINE-DM) 6.25-15 MG/5ML syrup Take 5 mLs by mouth at bedtime as needed for cough. 02/27/20   Wallis Bamberg, PA-C    Family History Family History  Problem Relation Age of Onset   Other  Mother        no health conditions    Other Father        no health conditions    Social History Social History   Tobacco Use   Smoking status: Some Days    Packs/day: 0.25    Types: Cigarettes   Smokeless tobacco: Never  Vaping Use   Vaping Use: Never used  Substance Use Topics   Alcohol use: Yes    Comment: occasionally   Drug use: No     Allergies   Nickel   Review of Systems Review of Systems  Musculoskeletal:  Positive for arthralgias and joint swelling.  All other systems reviewed and are negative.   Physical Exam Triage Vital Signs ED Triage Vitals  Enc Vitals Group     BP 09/23/20 1240 125/74     Pulse Rate 09/23/20 1240 (!) 52     Resp 09/23/20 1240 18     Temp 09/23/20 1240 99.2 F (37.3 C)     Temp Source 09/23/20 1240 Oral     SpO2 09/23/20 1240 100 %     Weight --      Height --      Head Circumference --      Peak Flow --      Pain Score 09/23/20 1244 10     Pain Loc --      Pain Edu? --  Excl. in GC? --    No data found.  Updated Vital Signs BP 125/74 (BP Location: Left Arm)   Pulse (!) 52   Temp 99.2 F (37.3 C) (Oral)   Resp 18   SpO2 100%   Visual Acuity Right Eye Distance:   Left Eye Distance:   Bilateral Distance:    Right Eye Near:   Left Eye Near:    Bilateral Near:     Physical Exam Vitals and nursing note reviewed.  Constitutional:      General: He is not in acute distress.    Appearance: Normal appearance. He is not ill-appearing, toxic-appearing or diaphoretic.  HENT:     Head: Normocephalic and atraumatic.  Eyes:     Conjunctiva/sclera: Conjunctivae normal.  Cardiovascular:     Rate and Rhythm: Normal rate.     Pulses: Normal pulses.  Pulmonary:     Effort: Pulmonary effort is normal.  Abdominal:     General: Abdomen is flat.  Musculoskeletal:     Left shoulder: Tenderness present. No swelling, deformity or bony tenderness. Decreased range of motion (full passive ROM but decrease active ROM due to  pain). Normal strength. Normal pulse.     Cervical back: Normal range of motion.  Skin:    General: Skin is warm and dry.  Neurological:     General: No focal deficit present.     Mental Status: He is alert and oriented to person, place, and time.  Psychiatric:        Mood and Affect: Mood normal.     UC Treatments / Results  Labs (all labs ordered are listed, but only abnormal results are displayed) Labs Reviewed - No data to display  EKG   Radiology No results found.  Procedures Procedures (including critical care time)  Medications Ordered in UC Medications - No data to display  Initial Impression / Assessment and Plan / UC Course  I have reviewed the triage vital signs and the nursing notes.  Pertinent labs & imaging results that were available during my care of the patient were reviewed by me and considered in my medical decision making (see chart for details).    Assessment negative for red flags or concerns.  Likely shoulder strain vs rotator cuff tendonitis.  Will treat with ibuprofen as needed for pain.  Recommend rest, ice, and elevation.  Discussed conservative symptom management.  Recommend follow up with orthopedics as this is an ongoing issue.   Final Clinical Impressions(s) / UC Diagnoses   Final diagnoses:  Shoulder strain, left, initial encounter     Discharge Instructions      Take the Ibuprofen as needed for pain.  You can also take Tylenol as needed for pain.   Rest as much as possible Ice for 10-15 minutes every 4-6 hours as needed for pain and swelling Elevate above your hip/heart when sitting and laying down  You can also try Icy/Hot, lidocaine patches, and Biofreeze for comfort.   Follow up with sports medicine or orthopedics if symptoms do not improve in the next few days.      ED Prescriptions     Medication Sig Dispense Auth. Provider   ibuprofen (ADVIL) 800 MG tablet Take 1 tablet (800 mg total) by mouth 3 (three) times daily.  21 tablet Ivette Loyal, NP      PDMP not reviewed this encounter.   Ivette Loyal, NP 09/23/20 1321

## 2020-09-23 NOTE — ED Notes (Signed)
Pt not answered.  

## 2020-09-23 NOTE — ED Triage Notes (Signed)
Pt in with c/o left arm pain for the past few days that has worsened. Pt states "it feels like something needs to pop"  denies any strenuous activity or injury  Pt has not tried otc medication for sx

## 2020-12-25 ENCOUNTER — Ambulatory Visit (HOSPITAL_COMMUNITY)
Admission: EM | Admit: 2020-12-25 | Discharge: 2020-12-25 | Disposition: A | Discharge status: Home / Self Care | Payer: BC Managed Care – PPO | Attending: Emergency Medicine | Admitting: Emergency Medicine

## 2020-12-25 ENCOUNTER — Other Ambulatory Visit: Payer: Self-pay

## 2020-12-25 ENCOUNTER — Encounter (HOSPITAL_COMMUNITY): Payer: Self-pay

## 2020-12-25 DIAGNOSIS — S83421A Sprain of lateral collateral ligament of right knee, initial encounter: Secondary | ICD-10-CM

## 2020-12-25 MED ORDER — DICLOFENAC SODIUM 50 MG PO TBEC: 50.0000 mg | DELAYED_RELEASE_TABLET | Freq: Two times a day (BID) | ORAL | 0 refills | Status: DC

## 2020-12-25 NOTE — ED Triage Notes (Signed)
Pt presents with knee pain. States he was playing basket ball and states he thinks he blew his knee out. States when he stretches his leg out he feels a pulling pain. States he noticed swelling.

## 2020-12-25 NOTE — Discharge Instructions (Signed)
Take diclofenac as prescribed. Continue with brace, rest, and elevate. If minimal improvement in a week follow-up with ortho for further evaluation.

## 2020-12-25 NOTE — ED Provider Notes (Signed)
[facility]    CSN: [id number] Arrival date & time: 12/25/20  1718      History   Chief Complaint Chief Complaint  Patient presents with   Knee Pain    HPI Sender Shawn Wells is a 30 y.o. male.   Patient presents with concerns of right knee pain. He reports last Wednesday he was playing basketball and after landing from a jump he had right knee pain and states he had to be helped off the court. He has continued to have knee pain since then, constant but worse with movements or weight-bearing. He has been wearing a brace with some improvement. He denies reports some tingling in the back of his calf at times. He denies prior similar or previous knee injury.   The history is provided by the patient.  Knee Pain  Past Medical History:  Diagnosis Date   Hypoglycemia     Patient Active Problem List   Diagnosis Date Noted   TBI (traumatic brain injury) 08/22/2015    History reviewed. No pertinent surgical history.     Home Medications    Prior to Admission medications   Medication Sig Start Date End Date Taking? Authorizing Provider  diclofenac (VOLTAREN) 50 MG EC tablet Take 1 tablet (50 mg total) by mouth 2 (two) times daily. 12/25/20  Yes Yeraldy Spike L, PA  albuterol (VENTOLIN HFA) 108 (90 Base) MCG/ACT inhaler Inhale 1-2 puffs into the lungs every 6 (six) hours as needed for wheezing or shortness of breath. 02/27/20   Wallis Bamberg, PA-C  benzonatate (TESSALON) 100 MG capsule Take 1-2 capsules (100-200 mg total) by mouth 3 (three) times daily as needed for cough. 02/27/20   Wallis Bamberg, PA-C  ibuprofen (ADVIL) 800 MG tablet Take 1 tablet (800 mg total) by mouth 3 (three) times daily. 09/23/20   Ivette Loyal, NP  predniSONE (DELTASONE) 20 MG tablet Take 2 tablets daily with breakfast. 02/27/20   Wallis Bamberg, PA-C  promethazine-dextromethorphan (PROMETHAZINE-DM) 6.25-15 MG/5ML syrup Take 5 mLs by mouth at bedtime as needed for cough. 02/27/20   Wallis Bamberg, PA-C     Family History Family History  Problem Relation Age of Onset   Other Mother        no health conditions    Other Father        no health conditions    Social History Social History   Tobacco Use   Smoking status: Some Days    Packs/day: 0.25    Types: Cigarettes   Smokeless tobacco: Never  Vaping Use   Vaping Use: Never used  Substance Use Topics   Alcohol use: Yes    Comment: occasionally   Drug use: No     Allergies   Nickel   Review of Systems Review of Systems  Cardiovascular:  Negative for leg swelling.  Musculoskeletal:  Positive for arthralgias and joint swelling.  Skin:  Negative for rash and wound.  Neurological:  Negative for weakness and numbness.    Physical Exam Triage Vital Signs ED Triage Vitals  Enc Vitals Group     BP 12/25/20 1759 114/65     Pulse Rate 12/25/20 1759 60     Resp 12/25/20 1759 17     Temp 12/25/20 1759 98.2 F (36.8 C)     Temp Source 12/25/20 1759 Oral     SpO2 12/25/20 1759 97 %     Weight --      Height --      Head Circumference --  Peak Flow --      Pain Score 12/25/20 1758 10     Pain Loc --      Pain Edu? --      Excl. in GC? --    No data found.  Updated Vital Signs BP 114/65 (BP Location: Right Arm)   Pulse 60   Temp 98.2 F (36.8 C) (Oral)   Resp 17   SpO2 97%   Visual Acuity Right Eye Distance:   Left Eye Distance:   Bilateral Distance:    Right Eye Near:   Left Eye Near:    Bilateral Near:     Physical Exam Vitals and nursing note reviewed.  Constitutional:      General: He is not in acute distress. HENT:     Head: Normocephalic.  Eyes:     Pupils: Pupils are equal, round, and reactive to light.  Cardiovascular:     Rate and Rhythm: Normal rate and regular rhythm.     Pulses: Normal pulses.     Heart sounds: Normal heart sounds.  Pulmonary:     Effort: Pulmonary effort is normal.     Breath sounds: Normal breath sounds.  Musculoskeletal:     Right knee: Swelling present.  No bony tenderness. Normal range of motion. Tenderness present.     Comments: Mild diffuse right knee swelling. Tenderness to lateral right knee. Full ROM but reports some discomfort. No laxity appreciated on stress testing.   Skin:    Findings: No bruising or rash.  Neurological:     Mental Status: He is alert.     Gait: Gait abnormal (antalgic favoring right).  Psychiatric:        Mood and Affect: Mood normal.     UC Treatments / Results  Labs (all labs ordered are listed, but only abnormal results are displayed) Labs Reviewed - No data to display  EKG   Radiology No results found.  Procedures Procedures (including critical care time)  Medications Ordered in UC Medications - No data to display  Initial Impression / Assessment and Plan / UC Course  I have reviewed the triage vital signs and the nursing notes.  Pertinent labs & imaging results that were available during my care of the patient were reviewed by me and considered in my medical decision making (see chart for details).     Consistent with LCL sprain, no laxity to indicate tear at this time. Conservative tx and f/u with ortho if no improvement.   E/M: 1 acute uncomplicated illness, no data, moderate risk due to prescription management  Final Clinical Impressions(s) / UC Diagnoses   Final diagnoses:  Sprain of lateral collateral ligament of right knee, initial encounter     Discharge Instructions      Take diclofenac as prescribed. Continue with brace, rest, and elevate. If minimal improvement in a week follow-up with ortho for further evaluation.      ED Prescriptions     Medication Sig Dispense Auth. Provider   diclofenac (VOLTAREN) 50 MG EC tablet Take 1 tablet (50 mg total) by mouth 2 (two) times daily. 10 tablet Vallery Sa, [place]a Phillippi L, PA      PDMP not reviewed this encounter.   Estanislado Pandy, [place] 12/25/20 Paulo Fruit

## 2021-07-21 ENCOUNTER — Other Ambulatory Visit: Payer: Self-pay

## 2021-07-21 ENCOUNTER — Emergency Department (HOSPITAL_COMMUNITY): Payer: Commercial Managed Care - HMO

## 2021-07-21 ENCOUNTER — Emergency Department (HOSPITAL_COMMUNITY)
Admission: EM | Admit: 2021-07-21 | Discharge: 2021-07-21 | Disposition: A | Discharge status: Home / Self Care | Payer: Commercial Managed Care - HMO | Attending: Emergency Medicine | Admitting: Emergency Medicine

## 2021-07-21 DIAGNOSIS — W228XXA Striking against or struck by other objects, initial encounter: Secondary | ICD-10-CM | POA: Diagnosis not present

## 2021-07-21 DIAGNOSIS — S62144A Nondisplaced fracture of body of hamate [unciform] bone, right wrist, initial encounter for closed fracture: Secondary | ICD-10-CM | POA: Insufficient documentation

## 2021-07-21 DIAGNOSIS — S62316A Displaced fracture of base of fifth metacarpal bone, right hand, initial encounter for closed fracture: Secondary | ICD-10-CM | POA: Insufficient documentation

## 2021-07-21 DIAGNOSIS — Y92511 Restaurant or cafe as the place of occurrence of the external cause: Secondary | ICD-10-CM | POA: Insufficient documentation

## 2021-07-21 DIAGNOSIS — Y990 Civilian activity done for income or pay: Secondary | ICD-10-CM | POA: Insufficient documentation

## 2021-07-21 DIAGNOSIS — M7989 Other specified soft tissue disorders: Secondary | ICD-10-CM | POA: Diagnosis not present

## 2021-07-21 DIAGNOSIS — S6991XA Unspecified injury of right wrist, hand and finger(s), initial encounter: Secondary | ICD-10-CM | POA: Diagnosis present

## 2021-07-21 MED ORDER — ACETAMINOPHEN 325 MG PO TABS: 650.0000 mg | ORAL_TABLET | Freq: Four times a day (QID) | ORAL | 0 refills | Status: AC | PRN

## 2021-07-21 MED ORDER — IBUPROFEN 800 MG PO TABS: 800.0000 mg | ORAL_TABLET | Freq: Three times a day (TID) | ORAL | 0 refills | Status: AC

## 2021-07-21 NOTE — Discharge Instructions (Signed)
Please call today to request an office appointment with the hand surgeon (in the next 1-2 weeks).  Let them know you were in the ER with a fractured hand today.

## 2021-07-21 NOTE — Progress Notes (Signed)
Orthopedic Tech Progress Note Patient Details:  Shawn Wells [birth year] [id number]  Ortho Devices Type of Ortho Device: Ulna gutter splint Ortho Device/Splint Location: RUE Ortho Device/Splint Interventions: Ordered, Application, Adjustment   Post Interventions Patient Tolerated: Well Instructions Provided: Care of device  Donald Pore 07/21/2021, 9:50 AM

## 2021-07-21 NOTE — ED Provider Notes (Signed)
[facility] EMERGENCY DEPARTMENT Provider Note   CSN: [id number] Arrival date & time: 07/21/21  4098     History  Chief Complaint  Patient presents with   Hand Injury    Shawn Wells is a 31 y.o. male presenting to ED with right hand pain after striking a wall 2 days ago.  Shawn Wells is right handed.  Shawn Wells works at a [employer].  HPI     Home Medications Prior to Admission medications   Medication Sig Start Date End Date Taking? Authorizing Provider  acetaminophen (TYLENOL) 325 MG tablet Take 2 tablets (650 mg total) by mouth every 6 (six) hours as needed for up to 30 doses for moderate pain or mild pain. 07/21/21  Yes Braxley Balandran, Kermit Balo, MD  ibuprofen (ADVIL) 800 MG tablet Take 1 tablet (800 mg total) by mouth 3 (three) times daily for 30 doses. 07/21/21 07/31/21 Yes Mandi Mattioli, Kermit Balo, MD  albuterol (VENTOLIN HFA) 108 (90 Base) MCG/ACT inhaler Inhale 1-2 puffs into the lungs every 6 (six) hours as needed for wheezing or shortness of breath. 02/27/20   Wallis Bamberg, PA-C  benzonatate (TESSALON) 100 MG capsule Take 1-2 capsules (100-200 mg total) by mouth 3 (three) times daily as needed for cough. 02/27/20   Wallis Bamberg, PA-C  diclofenac (VOLTAREN) 50 MG EC tablet Take 1 tablet (50 mg total) by mouth 2 (two) times daily. 12/25/20   Vallery Sa, Amy L, PA  ibuprofen (ADVIL) 800 MG tablet Take 1 tablet (800 mg total) by mouth 3 (three) times daily. 09/23/20   Ivette Loyal, NP  predniSONE (DELTASONE) 20 MG tablet Take 2 tablets daily with breakfast. 02/27/20   Wallis Bamberg, PA-C  promethazine-dextromethorphan (PROMETHAZINE-DM) 6.25-15 MG/5ML syrup Take 5 mLs by mouth at bedtime as needed for cough. 02/27/20   Wallis Bamberg, PA-C      Allergies    Nickel    Review of Systems   Review of Systems  Physical Exam Updated Vital Signs BP 127/71 (BP Location: Right Arm)   Pulse 72   Temp 98 F (36.7 C) (Oral)   Resp 16   SpO2 100%  Physical Exam Constitutional:      General:  Shawn Wells is not in acute distress. HENT:     Head: Normocephalic and atraumatic.  Eyes:     Conjunctiva/sclera: Conjunctivae normal.     Pupils: Pupils are equal, round, and reactive to light.  Cardiovascular:     Rate and Rhythm: Normal rate and regular rhythm.     Pulses: Normal pulses.  Pulmonary:     Effort: Pulmonary effort is normal. No respiratory distress.  Musculoskeletal:     Comments: Tenderness, swelling near base of 4th and 5th metacarpal Patient is able to fully flex and extend fingers and thumb  Skin:    General: Skin is warm and dry.  Neurological:     General: No focal deficit present.     Mental Status: Shawn Wells is alert and oriented to person, place, and time. Mental status is at baseline.     Sensory: No sensory deficit.  Psychiatric:        Mood and Affect: Mood normal.        Behavior: Behavior normal.    ED Results / Procedures / Treatments   Labs (all labs ordered are listed, but only abnormal results are displayed) Labs Reviewed - No data to display  EKG None  Radiology DG Hand Complete Right  Result Date: 07/21/2021 CLINICAL DATA:  Hand injury, hit  a wall 2 days ago, RIGHT hand pain and swelling, pain at fifth metacarpal EXAM: RIGHT HAND - COMPLETE 3+ VIEW COMPARISON:  08/28/2012 FINDINGS: Osseous mineralization normal. Displaced fracture identified at base of fifth metacarpal with ulnar subluxation of fifth metacarpal at fifth CMC joint. Additional small displaced fracture at the ulnar margin of the hamate. No additional fracture, dislocation, or bone destruction. IMPRESSION: Displaced intra-articular fracture at base of fifth metacarpal with ulnar subluxation of the shaft of the fifth metacarpal. Associated mildly displaced fracture at ulnar margin of hamate. Electronically Signed   By: Ulyses Southward M.D.   On: 07/21/2021 08:24    Procedures Procedures    Medications Ordered in ED Medications - No data to display  ED Course/ Medical Decision Making/ A&P                            Medical Decision Making Risk OTC drugs. Prescription drug management.   Right hand pain, ddx includes fracture vs sprain vs other  Doubt infection with this mechanism  Neurovascularly intact on exam  Xrays ordered and personally interpreted showing fracture at the base of the fifth metatarsal, displaced, as well as hamate fracture.  The patient will need close orthopedic follow-up.  Patient placed in volar splint.  Shawn Wells was advised not to use this hand for any activities.  Orthopedic hand surgeon's information provided for follow up.        Final Clinical Impression(s) / ED Diagnoses Final diagnoses:  Closed displaced fracture of base of fifth metacarpal bone of right hand, initial encounter  Closed nondisplaced fracture of hamate bone of right wrist, unspecified portion of hamate, initial encounter    Rx / DC Orders ED Discharge Orders          Ordered    ibuprofen (ADVIL) 800 MG tablet  3 times daily        07/21/21 0842    acetaminophen (TYLENOL) 325 MG tablet  Every 6 hours PRN        07/21/21 0842              Terald Sleeper, MD 07/21/21 1244

## 2021-07-21 NOTE — ED Triage Notes (Signed)
Pt. Stated, I hit a wall 2 days ago. Rt. Hand swollen.

## 2021-07-23 ENCOUNTER — Encounter: Payer: Self-pay | Admitting: Orthopedic Surgery

## 2021-07-23 ENCOUNTER — Ambulatory Visit (INDEPENDENT_AMBULATORY_CARE_PROVIDER_SITE_OTHER): Payer: Commercial Managed Care - HMO | Admitting: Orthopedic Surgery

## 2021-07-23 DIAGNOSIS — S63054A Dislocation of other carpometacarpal joint of right hand, initial encounter: Secondary | ICD-10-CM | POA: Diagnosis not present

## 2021-07-23 NOTE — [place] (Signed)
 Office Visit Note   Patient: Shawn Wells           Date of Birth: 01/30/1991           MRN: [id number] Visit Date: 07/23/2021              Requested by: Harris, Kimberly S, FNP [address] [place],  [place] [address] PCP: Harris, Kimberly S, FNP   Assessment & Plan: Visit Diagnoses:  1. CMC (carpometacarpal joint) dislocation, right, initial encounter     Plan: We reviewed patient's x-ray which shows ulnar subluxation of the right fifth metacarpal at the CMC joint with a fracture of the fifth metacarpal base.  We discussed that this is an unstable injury and in fact the patient can feel his fifth metacarpal subluxing and reducing within the splint.  We discussed that this need surgical fixation with close reduction and percutaneous pinning.  We discussed the nature of the surgery as well as expected postoperative course.  We reviewed the risks of surgery including bleeding, infection, damage to neurovascular structures, recurrent instability, need for additional procedures.  Patient understands and wishes to proceed with surgery.  Surgical date and time will be confirmed with the patient.  Follow-Up Instructions: No follow-ups on file.   Orders:  No orders of the defined types were placed in this encounter.  No orders of the defined types were placed in this encounter.     Procedures: No procedures performed   Clinical Data: No additional findings.   Subjective: Chief Complaint  Patient presents with   Right Little Finger - Fracture, Injury    DOI:07/20/21, punched a wall    This is a 31-year-old right-hand-dominant male who presents for ER follow-up of a right fifth metacarpal base fracture with CMC dislocation.  Patient punched a wall on Monday of this week.  He is seen in the ER was found to have dislocation of the CMC joint and placed in ulnar gutter splint.  He is taking ibuprofen for pain.  His pain is 1/10 at worst currently and localized to the ulnar  aspect of the hand at the fifth CMC joint..  He does note that he can feel the bones sublux and reduce within the splint.  Injury    Review of Systems   Objective: Vital Signs: BP 125/66 (BP Location: Left Arm, Patient Position: Sitting)   Pulse 60   Ht 6' 2" (1.88 m)   Wt 160 lb (72.6 kg)   BMI 20.54 kg/m   Physical Exam Constitutional:      Appearance: Normal appearance.  Cardiovascular:     Rate and Rhythm: Normal rate.     Pulses: Normal pulses.  Pulmonary:     Effort: Pulmonary effort is normal.  Skin:    General: Skin is warm and dry.     Capillary Refill: Capillary refill takes less than 2 seconds.  Neurological:     Mental Status: He is alert.     Right Hand Exam   Other  Erythema: absent Sensation: normal Pulse: present  Comments:  Splint clean and dry.  SILT in exposed ring and small fingers.       Specialty Comments:  No specialty comments available.  Imaging: No results found.   PMFS History: Patient Active Problem List   Diagnosis Date Noted   CMC (carpometacarpal joint) dislocation, right, initial encounter 07/23/2021   TBI (traumatic brain injury) (HCC) 08/22/2015   Past Medical History:  Diagnosis Date   Hypoglycemia       Family History  Problem Relation Age of Onset   Other Mother        no health conditions    Other Father        no health conditions    No past surgical history on file. Social History   Occupational History   Not on file  Tobacco Use   Smoking status: Some Days    Packs/day: 0.25    Types: Cigarettes   Smokeless tobacco: Never  Vaping Use   Vaping Use: Never used  Substance and Sexual Activity   Alcohol use: Yes    Comment: occasionally   Drug use: No   Sexual activity: Not on file        

## 2021-07-23 NOTE — H&P (View-Only) (Signed)
Office Visit Note   Patient: Shawn Wells           Date of Birth: 1990-12-20           MRN: [id number] Visit Date: 07/23/2021              Requested by: Scot Jun, [address] [place],  [place] [address] PCP: Scot Jun, FNP   Assessment & Plan: Visit Diagnoses:  1. CMC (carpometacarpal joint) dislocation, right, initial encounter     Plan: We reviewed patient's x-ray which shows ulnar subluxation of the right fifth metacarpal at the [facility] joint with a fracture of the fifth metacarpal base.  We discussed that this is an unstable injury and in fact the patient can feel his fifth metacarpal subluxing and reducing within the splint.  We discussed that this need surgical fixation with close reduction and percutaneous pinning.  We discussed the nature of the surgery as well as expected postoperative course.  We reviewed the risks of surgery including bleeding, infection, damage to neurovascular structures, recurrent instability, need for additional procedures.  Patient understands and wishes to proceed with surgery.  Surgical date and time will be confirmed with the patient.  Follow-Up Instructions: No follow-ups on file.   Orders:  No orders of the defined types were placed in this encounter.  No orders of the defined types were placed in this encounter.     Procedures: No procedures performed   Clinical Data: No additional findings.   Subjective: Chief Complaint  Patient presents with   Right Little Finger - Fracture, Injury    DOI:07/20/21, punched a wall    This is a 31 year old right-hand-dominant male who presents for ER follow-up of a right fifth metacarpal base fracture with CMC dislocation.  Patient punched a wall on Monday of this week.  He is seen in the ER was found to have dislocation of the [facility] joint and placed in ulnar gutter splint.  He is taking ibuprofen for pain.  His pain is 1/10 at worst currently and localized to the ulnar  aspect of the hand at the fifth [facility] joint..  He does note that he can feel the bones sublux and reduce within the splint.  Injury    Review of Systems   Objective: Vital Signs: BP 125/66 (BP Location: Left Arm, Patient Position: Sitting)   Pulse 60   Ht 6\' 2"  (1.88 m)   Wt 160 lb (72.6 kg)   BMI 20.54 kg/m   Physical Exam Constitutional:      Appearance: Normal appearance.  Cardiovascular:     Rate and Rhythm: Normal rate.     Pulses: Normal pulses.  Pulmonary:     Effort: Pulmonary effort is normal.  Skin:    General: Skin is warm and dry.     Capillary Refill: Capillary refill takes less than 2 seconds.  Neurological:     Mental Status: He is alert.     Right Hand Exam   Other  Erythema: absent Sensation: normal Pulse: present  Comments:  Splint clean and dry.  SILT in exposed ring and small fingers.       Specialty Comments:  No specialty comments available.  Imaging: No results found.   PMFS History: Patient Active Problem List   Diagnosis Date Noted   [facility] (carpometacarpal joint) dislocation, right, initial encounter 07/23/2021   TBI (traumatic brain injury) ([place]) 08/22/2015   Past Medical History:  Diagnosis Date   Hypoglycemia  Family History  Problem Relation Age of Onset   Other Mother        no health conditions    Other Father        no health conditions    No past surgical history on file. Social History   Occupational History   Not on file  Tobacco Use   Smoking status: Some Days    Packs/day: 0.25    Types: Cigarettes   Smokeless tobacco: Never  Vaping Use   Vaping Use: Never used  Substance and Sexual Activity   Alcohol use: Yes    Comment: occasionally   Drug use: No   Sexual activity: Not on file

## 2021-07-26 ENCOUNTER — Encounter (HOSPITAL_BASED_OUTPATIENT_CLINIC_OR_DEPARTMENT_OTHER): Payer: Self-pay | Admitting: Orthopedic Surgery

## 2021-07-26 ENCOUNTER — Other Ambulatory Visit: Payer: Self-pay

## 2021-07-27 NOTE — [place] Notes (Signed)

## 2021-07-28 ENCOUNTER — Encounter (HOSPITAL_BASED_OUTPATIENT_CLINIC_OR_DEPARTMENT_OTHER): Payer: Self-pay | Admitting: Orthopedic Surgery

## 2021-07-28 ENCOUNTER — Encounter (HOSPITAL_BASED_OUTPATIENT_CLINIC_OR_DEPARTMENT_OTHER)
Admission: RE | Disposition: A | Discharge status: Home / Self Care | Payer: Self-pay | Source: Home / Self Care | Attending: Orthopedic Surgery

## 2021-07-28 ENCOUNTER — Other Ambulatory Visit: Payer: Self-pay

## 2021-07-28 ENCOUNTER — Ambulatory Visit (HOSPITAL_BASED_OUTPATIENT_CLINIC_OR_DEPARTMENT_OTHER)
Admission: RE | Admit: 2021-07-28 | Discharge: 2021-07-28 | Disposition: A | Discharge status: Home / Self Care | Payer: Commercial Managed Care - HMO | Attending: Orthopedic Surgery | Admitting: Orthopedic Surgery

## 2021-07-28 ENCOUNTER — Ambulatory Visit (HOSPITAL_BASED_OUTPATIENT_CLINIC_OR_DEPARTMENT_OTHER): Payer: Commercial Managed Care - HMO | Admitting: Anesthesiology

## 2021-07-28 ENCOUNTER — Ambulatory Visit (HOSPITAL_BASED_OUTPATIENT_CLINIC_OR_DEPARTMENT_OTHER): Payer: Commercial Managed Care - HMO

## 2021-07-28 DIAGNOSIS — W2209XA Striking against other stationary object, initial encounter: Secondary | ICD-10-CM | POA: Diagnosis not present

## 2021-07-28 DIAGNOSIS — F17210 Nicotine dependence, cigarettes, uncomplicated: Secondary | ICD-10-CM | POA: Diagnosis not present

## 2021-07-28 DIAGNOSIS — S63054A Dislocation of other carpometacarpal joint of right hand, initial encounter: Secondary | ICD-10-CM

## 2021-07-28 DIAGNOSIS — S62306A Unspecified fracture of fifth metacarpal bone, right hand, initial encounter for closed fracture: Secondary | ICD-10-CM | POA: Diagnosis not present

## 2021-07-28 DIAGNOSIS — Z87820 Personal history of traumatic brain injury: Secondary | ICD-10-CM | POA: Insufficient documentation

## 2021-07-28 DIAGNOSIS — S62316A Displaced fracture of base of fifth metacarpal bone, right hand, initial encounter for closed fracture: Secondary | ICD-10-CM | POA: Diagnosis not present

## 2021-07-28 HISTORY — PX: CLOSED REDUCTION FINGER WITH PERCUTANEOUS PINNING: SHX5612

## 2021-07-28 SURGERY — CLOSED REDUCTION, FINGER, WITH PERCUTANEOUS PINNING
Anesthesia: General | Site: Finger | Laterality: Right

## 2021-07-28 MED ORDER — OXYCODONE HCL 5 MG/5ML PO SOLN: 5.0000 mg | Freq: Once | ORAL | Status: AC | PRN

## 2021-07-28 MED ORDER — FENTANYL CITRATE (PF) 100 MCG/2ML IJ SOLN
INTRAMUSCULAR | Status: AC
  Filled 2021-07-28: qty 2

## 2021-07-28 MED ORDER — PROPOFOL 10 MG/ML IV BOLUS
INTRAVENOUS | Status: DC | PRN
  Administered 2021-07-28: 200 mg via INTRAVENOUS

## 2021-07-28 MED ORDER — MIDAZOLAM HCL 2 MG/2ML IJ SOLN
INTRAMUSCULAR | Status: AC
  Filled 2021-07-28: qty 2

## 2021-07-28 MED ORDER — DEXAMETHASONE SODIUM PHOSPHATE 10 MG/ML IJ SOLN
INTRAMUSCULAR | Status: DC | PRN
  Administered 2021-07-28: 4 mg via INTRAVENOUS

## 2021-07-28 MED ORDER — OXYCODONE HCL 5 MG PO TABS: 5.0000 mg | ORAL_TABLET | Freq: Four times a day (QID) | ORAL | 0 refills | Status: AC | PRN

## 2021-07-28 MED ORDER — CEFAZOLIN SODIUM-DEXTROSE 2-4 GM/100ML-% IV SOLN
INTRAVENOUS | Status: AC
  Filled 2021-07-28: qty 100

## 2021-07-28 MED ORDER — CEFAZOLIN SODIUM-DEXTROSE 2-4 GM/100ML-% IV SOLN
2.0000 g | INTRAVENOUS | Status: AC
  Administered 2021-07-28: 2 g via INTRAVENOUS

## 2021-07-28 MED ORDER — DEXMEDETOMIDINE (PRECEDEX) IN NS 20 MCG/5ML (4 MCG/ML) IV SYRINGE
PREFILLED_SYRINGE | INTRAVENOUS | Status: DC | PRN
  Administered 2021-07-28 (×4): 4 ug via INTRAVENOUS

## 2021-07-28 MED ORDER — MIDAZOLAM HCL 5 MG/5ML IJ SOLN
INTRAMUSCULAR | Status: DC | PRN
  Administered 2021-07-28: 2 mg via INTRAVENOUS

## 2021-07-28 MED ORDER — OXYCODONE HCL 5 MG PO TABS
ORAL_TABLET | ORAL | Status: AC
  Filled 2021-07-28: qty 1

## 2021-07-28 MED ORDER — FENTANYL CITRATE (PF) 100 MCG/2ML IJ SOLN
INTRAMUSCULAR | Status: DC | PRN
Start: 2021-07-28 — End: 2021-07-28
  Administered 2021-07-28 (×2): 50 ug via INTRAVENOUS

## 2021-07-28 MED ORDER — ONDANSETRON HCL 4 MG/2ML IJ SOLN
4.0000 mg | Freq: Once | INTRAMUSCULAR | Status: DC | PRN
Start: 2021-07-28 — End: 2021-07-28

## 2021-07-28 MED ORDER — DEXMEDETOMIDINE (PRECEDEX) IN NS 20 MCG/5ML (4 MCG/ML) IV SYRINGE
PREFILLED_SYRINGE | INTRAVENOUS | Status: AC
  Filled 2021-07-28: qty 5

## 2021-07-28 MED ORDER — PROPOFOL 500 MG/50ML IV EMUL
INTRAVENOUS | Status: DC | PRN
  Administered 2021-07-28: 25 ug/kg/min via INTRAVENOUS

## 2021-07-28 MED ORDER — LACTATED RINGERS IV SOLN: INTRAVENOUS | Status: DC

## 2021-07-28 MED ORDER — OXYCODONE HCL 5 MG PO TABS
5.0000 mg | ORAL_TABLET | Freq: Once | ORAL | Status: AC | PRN
  Administered 2021-07-28: 5 mg via ORAL

## 2021-07-28 MED ORDER — LIDOCAINE 2% (20 MG/ML) 5 ML SYRINGE
INTRAMUSCULAR | Status: DC | PRN
  Administered 2021-07-28: 60 mg via INTRAVENOUS

## 2021-07-28 MED ORDER — ONDANSETRON HCL 4 MG/2ML IJ SOLN
INTRAMUSCULAR | Status: DC | PRN
  Administered 2021-07-28: 4 mg via INTRAVENOUS

## 2021-07-28 MED ORDER — FENTANYL CITRATE (PF) 100 MCG/2ML IJ SOLN: 25.0000 ug | INTRAMUSCULAR | Status: DC | PRN

## 2021-07-28 SURGICAL SUPPLY — 53 items
APL PRP STRL LF DISP 70% ISPRP (MISCELLANEOUS) ×1
BLADE SURG 15 STRL LF DISP TIS (BLADE) IMPLANT
BLADE SURG 15 STRL SS (BLADE) ×2
BNDG CMPR 9X4 STRL LF SNTH (GAUZE/BANDAGES/DRESSINGS) ×1
BNDG COHESIVE 1X5 TAN STRL LF (GAUZE/BANDAGES/DRESSINGS) IMPLANT
BNDG ELASTIC 3X5.8 VLCR STR LF (GAUZE/BANDAGES/DRESSINGS) ×2 IMPLANT
BNDG ELASTIC 4X5.8 VLCR STR LF (GAUZE/BANDAGES/DRESSINGS) ×1 IMPLANT
BNDG ESMARK 4X9 LF (GAUZE/BANDAGES/DRESSINGS) ×1 IMPLANT
BNDG GAUZE DERMACEA FLUFF (GAUZE/BANDAGES/DRESSINGS) ×1
BNDG GAUZE DERMACEA FLUFF 4 (GAUZE/BANDAGES/DRESSINGS) ×1 IMPLANT
BNDG GZE DERMACEA 4 6PLY (GAUZE/BANDAGES/DRESSINGS) ×1
CHLORAPREP W/TINT 26 (MISCELLANEOUS) ×2 IMPLANT
CORD BIPOLAR FORCEPS 12FT (ELECTRODE) IMPLANT
COVER BACK TABLE 60X90IN (DRAPES) ×2 IMPLANT
COVER MAYO STAND STRL (DRAPES) ×2 IMPLANT
CUFF TOURN SGL QUICK 18X4 (TOURNIQUET CUFF) IMPLANT
CUFF TOURN SGL QUICK 24 (TOURNIQUET CUFF)
CUFF TRNQT CYL 24X4X16.5-23 (TOURNIQUET CUFF) IMPLANT
DRAPE EXTREMITY T 121X128X90 (DISPOSABLE) ×2 IMPLANT
DRAPE OEC MINIVIEW 54X84 (DRAPES) ×2 IMPLANT
DRAPE SURG 17X23 STRL (DRAPES) ×1 IMPLANT
DRAPE U-SHAPE 47X51 STRL (DRAPES) ×1 IMPLANT
GAUZE SPONGE 4X4 12PLY STRL (GAUZE/BANDAGES/DRESSINGS) ×1 IMPLANT
GAUZE XEROFORM 1X8 LF (GAUZE/BANDAGES/DRESSINGS) ×2 IMPLANT
GLOVE BIO SURGEON STRL SZ7 (GLOVE) ×2 IMPLANT
GOWN STRL REUS W/ TWL LRG LVL3 (GOWN DISPOSABLE) ×2 IMPLANT
GOWN STRL REUS W/ TWL XL LVL3 (GOWN DISPOSABLE) IMPLANT
GOWN STRL REUS W/TWL LRG LVL3 (GOWN DISPOSABLE) ×4
GOWN STRL REUS W/TWL XL LVL3 (GOWN DISPOSABLE)
K-WIRE DBL .045X4 NSTRL (WIRE) ×2
K-WIRE DBL .062X4 NSTRL (WIRE) ×4
KWIRE DBL .045X4 NSTRL (WIRE) IMPLANT
KWIRE DBL .062X4 NSTRL (WIRE) IMPLANT
NDL HYPO 25X1 1.5 SAFETY (NEEDLE) IMPLANT
NEEDLE HYPO 25X1 1.5 SAFETY (NEEDLE) ×2 IMPLANT
NS IRRIG 1000ML POUR BTL (IV SOLUTION) ×2 IMPLANT
PACK BASIN DAY SURGERY FS (CUSTOM PROCEDURE TRAY) ×2 IMPLANT
PAD CAST 3X4 CTTN HI CHSV (CAST SUPPLIES) IMPLANT
PADDING CAST COTTON 3X4 STRL (CAST SUPPLIES) ×2
SLEEVE SCD COMPRESS KNEE MED (STOCKING) ×1 IMPLANT
SLING ARM FOAM STRAP LRG (SOFTGOODS) ×1 IMPLANT
SPLINT FIBERGLASS 4X30 (CAST SUPPLIES) ×1 IMPLANT
SPLINT PLASTER CAST XFAST 4X15 (CAST SUPPLIES) ×10 IMPLANT
SPLINT PLASTER XTRA FAST SET 4 (CAST SUPPLIES)
SUCTION FRAZIER HANDLE 10FR (MISCELLANEOUS) ×2
SUCTION TUBE FRAZIER 10FR DISP (MISCELLANEOUS) ×1 IMPLANT
SUT ETHILON 4 0 PS 2 18 (SUTURE) ×1 IMPLANT
SUT MNCRL AB 3-0 PS2 18 (SUTURE) ×1 IMPLANT
SUT VICRYL 4-0 PS2 18IN ABS (SUTURE) IMPLANT
SYR BULB EAR ULCER 3OZ GRN STR (SYRINGE) IMPLANT
SYR CONTROL 10ML LL (SYRINGE) IMPLANT
TOWEL GREEN STERILE FF (TOWEL DISPOSABLE) ×4 IMPLANT
UNDERPAD 30X36 HEAVY ABSORB (UNDERPADS AND DIAPERS) ×2 IMPLANT

## 2021-07-28 NOTE — Discharge Instructions (Addendum)
Audria Nine, M.D. Hand Surgery  POST-OPERATIVE DISCHARGE INSTRUCTIONS   PRESCRIPTIONS: You may have been given a prescription to be taken as directed for post-operative pain control.  You may also take over the counter ibuprofen/aleve and tylenol for pain. Take this as directed on the packaging. Do not exceed 3000 mg tylenol/acetaminophen in 24 hours.  Ibuprofen 600-800 mg (3-4) tablets by mouth every 6 hours as needed for pain.  OR Aleve 2 tablets by mouth every 12 hours (twice daily) as needed for pain.  AND/OR Tylenol 1000 mg (2 tablets) every 8 hours as needed for pain.  Please use your pain medication carefully, as refills are limited and you may not be provided with one.  As stated above, please use over the counter pain medicine - it will also be helpful with decreasing your swelling.    ANESTHESIA: After your surgery, post-surgical discomfort or pain is likely. This discomfort can last several days to a few weeks. At certain times of the day your discomfort may be more intense.   Did you receive a nerve block?  A nerve block can provide pain relief for one hour to two days after your surgery. As long as the nerve block is working, you will experience little or no sensation in the area the surgeon operated on.  As the nerve block wears off, you will begin to experience pain or discomfort. It is very important that you begin taking your prescribed pain medication before the nerve block fully wears off. Treating your pain at the first sign of the block wearing off will ensure your pain is better controlled and more tolerable when full-sensation returns. Do not wait until the pain is intolerable, as the medicine will be less effective. It is better to treat pain in advance than to try and catch up.   General Anesthesia:  If you did not receive a nerve block during your surgery, you will need to start taking your pain medication shortly after your surgery and should continue  to do so as prescribed by your surgeon.     ICE AND ELEVATION: You may use ice for the first 48-72 hours, but it is not critical.   Elevation, as much as possible for the next 48 hours, is critical for decreasing swelling as well as for pain relief. Elevation means when you are seated or lying down, you hand should be at or above your heart. When walking, the hand needs to be at or above the level of your elbow.  If the bandage gets too tight, it may need to be loosened. Please contact our office and we will instruct you in how to do this.    SURGICAL BANDAGES:  Keep your dressing and/or splint clean and dry at all times.  Do not remove until you are seen again in the office.  If careful, you may place a plastic bag over your bandage and tape the end to shower, but be careful, do not get your bandages wet.     HAND THERAPY:  You may not need any. If you do, we will begin this at your follow up visit in the clinic.    ACTIVITY AND WORK: You are encouraged to move any fingers which are not in the bandage.  Light use of the fingers is allowed to assist the other hand with daily hygiene and eating, but strong gripping or lifting is often uncomfortable and should be avoided.  You might miss a variable period of time  from work and hopefully this issue has been discussed prior to surgery. You may not do any heavy work with your affected hand for about 2 weeks.    [facility] [address] [place],  [place]  [address] [phone]      Post Anesthesia Home Care Instructions  Activity: Get plenty of rest for the remainder of the day. A responsible individual must stay with you for 24 hours following the procedure.  For the next 24 hours, DO NOT: -Drive a car -Advertising copywriter -Drink alcoholic beverages -Take any medication unless instructed by your physician -Make any legal decisions or sign important papers.  Meals: Start with liquid foods such as gelatin or  soup. Progress to regular foods as tolerated. Avoid greasy, spicy, heavy foods. If nausea and/or vomiting occur, drink only clear liquids until the nausea and/or vomiting subsides. Call your physician if vomiting continues.  Special Instructions/Symptoms: Your throat may feel dry or sore from the anesthesia or the breathing tube placed in your throat during surgery. If this causes discomfort, gargle with warm salt water. The discomfort should disappear within 24 hours.  If you had a scopolamine patch placed behind your ear for the management of post- operative nausea and/or vomiting:  1. The medication in the patch is effective for 72 hours, after which it should be removed.  Wrap patch in a tissue and discard in the trash. Wash hands thoroughly with soap and water. 2. You may remove the patch earlier than 72 hours if you experience unpleasant side effects which may include dry mouth, dizziness or visual disturbances. 3. Avoid touching the patch. Wash your hands with soap and water after contact with the patch.

## 2021-07-28 NOTE — Transfer of Care (Signed)
Immediate Anesthesia Transfer of Care Note  Patient: Shawn Wells  Procedure(s) Performed: RIGHT 5TH CARPOMETACARPAL CLOSED REDUCTION  WITH PERCUTANEOUS PINNING (Right: Finger)  Patient Location: PACU  Anesthesia Type:General  Level of Consciousness: drowsy  Airway & Oxygen Therapy: Patient Spontanous Breathing and Patient connected to face mask oxygen  Post-op Assessment: Report given to RN and Post -op Vital signs reviewed and stable  Post vital signs: Reviewed and stable  Last Vitals:  Vitals Value Taken Time  BP 110/63   Temp    Pulse 59 07/28/21 1522  Resp 21 07/28/21 1523  SpO2 100 % 07/28/21 1522  Vitals shown include unvalidated device data.  Last Pain:  Vitals:   07/28/21 1314  TempSrc: Oral  PainSc: 5       Patients Stated Pain Goal: 8 ([id number])  Complications: No notable events documented.

## 2021-07-28 NOTE — Anesthesia Preprocedure Evaluation (Addendum)
Anesthesia Evaluation  Patient identified by MRN, date of birth, ID band Patient awake    Reviewed: Allergy & Precautions, NPO status , Patient's Chart, lab work & pertinent test results  Airway Mallampati: II  TM Distance: >3 FB Neck ROM: Full    Dental no notable dental hx. (+) Dental Advisory Given   Pulmonary Current Smoker,    Pulmonary exam normal breath sounds clear to auscultation       Cardiovascular negative cardio ROS Normal cardiovascular exam Rhythm:Regular Rate:Normal     Neuro/Psych TBI 2017 negative psych ROS   GI/Hepatic negative GI ROS, Neg liver ROS,   Endo/Other  negative endocrine ROS  Renal/GU negative Renal ROS  negative genitourinary   Musculoskeletal Right 5th metacarpal Fx   Abdominal   Peds  Hematology negative hematology ROS (+)   Anesthesia Other Findings   Reproductive/Obstetrics                            Anesthesia Physical Anesthesia Plan  ASA: 2  Anesthesia Plan: General   Post-op Pain Management: Minimal or no pain anticipated and Regional block*   Induction: Intravenous  PONV Risk Score and Plan: 2 and Treatment may vary due to age or medical condition, Ondansetron, Dexamethasone and Midazolam  Airway Management Planned: LMA  Additional Equipment: None  Intra-op Plan:   Post-operative Plan: Extubation in OR  Informed Consent: I have reviewed the patients History and Physical, chart, labs and discussed the procedure including the risks, benefits and alternatives for the proposed anesthesia with the patient or authorized representative who has indicated his/her understanding and acceptance.     Dental advisory given  Plan Discussed with: CRNA and Anesthesiologist  Anesthesia Plan Comments:        Anesthesia Quick Evaluation

## 2021-07-28 NOTE — Interval H&P Note (Signed)
History and Physical Interval Note:  07/28/2021 2:02 PM  Shawn Wells  has presented today for surgery, with the diagnosis of RIGHT 5TH MAETACARPAL FRACTURE.  The various methods of treatment have been discussed with the patient and family. After consideration of risks, benefits and other options for treatment, the patient has consented to  Procedure(s): RIGHT 5TH CARPOMETACARPAL CLOSED REDUCTION  WITH PERCUTANEOUS PINNING (Right) as a surgical intervention.  [name] history has been reviewed, patient examined, no change in status, stable for surgery.  I have reviewed the patient's chart and labs.  Questions were answered to the patient's satisfaction.     Lurleen Soltero Casanova Schurman

## 2021-07-28 NOTE — Anesthesia Procedure Notes (Signed)
Procedure Name: LMA Insertion Date/Time: 07/28/2021 2:34 PM  Performed by: Jerzee Jerome, Jewel Baize, CRNAPre-anesthesia Checklist: Patient identified, Emergency Drugs available, Suction available and Patient being monitored Patient Re-evaluated:Patient Re-evaluated prior to induction Oxygen Delivery Method: Circle system utilized Preoxygenation: Pre-oxygenation with 100% oxygen Induction Type: IV induction Ventilation: Mask ventilation without difficulty LMA: LMA inserted LMA Size: 4.0 Number of attempts: 1 Airway Equipment and Method: Bite block Placement Confirmation: positive ETCO2 Tube secured with: Tape Dental Injury: Teeth and Oropharynx as per pre-operative assessment

## 2021-07-28 NOTE — Op Note (Signed)
   Date of Surgery: 07/28/2021  INDICATIONS: Patient is a 31 y.o.-year-old male with a closed, right fifth CMC fracture dislocation after punching a wall last Monday.  Risks, benefits, and alternatives to surgery were again discussed with the patient in the preoperative area. The patient wishes to proceed with surgery.  Informed consent was signed after our discussion.   PREOPERATIVE DIAGNOSIS:  Right 5th CMC fracture dislocation  POSTOPERATIVE DIAGNOSIS: Same.  PROCEDURE:  Closed reduction and percutaneous pinning of right 5th CMC fracture dislocation   SURGEON: Audria Nine, M.D.  ASSIST:   ANESTHESIA:  general  IV FLUIDS AND URINE: See anesthesia.  ESTIMATED BLOOD LOSS: <5 mL.  IMPLANTS: 0.062" k-wire x 2, 0.045" k-wire x 1  DRAINS: None  COMPLICATIONS: None  DESCRIPTION OF PROCEDURE: The patient was met in the preoperative holding area where the surgical site was marked and the consent form was verified.  The patient was then taken to the operating room and transferred to the operating table.  All bony prominences were well padded.  A tourniquet was applied to the right upper arm.  General endotracheal anesthesia was induced.  The operative extremity was prepped and draped in the usual and sterile fashion.  A formal time-out was performed to confirm that this was the correct patient, surgery, side, and site.   Following timeout, the mini fluoroscopy was she was brought in.  The fifth CMC dislocation was easily reduced with traction on the small finger and pressure on the base of the fifth metacarpal.  A 0.062 inch K wire was advanced from the ulnar aspect of the fifth metacarpal base into the body of the hamate.  AP, lateral, and oblique views demonstrated appropriate reduction of the dislocation and appropriate K wire placement within the body of the hamate.  A second 0.062 inch K wire was advanced from the ulnar base of the fifth metacarpal through the base of the fourth  metacarpal.  This K wire engaged all 4 cortices.  The third 0.045 inch K wire was advanced from the ulnar aspect of the fifth metacarpal base into the body of the hamate for additional fixation.  Final fluoroscopic views demonstrated appropriate reduction of the fracture dislocation.  All K wires were within the fifth metacarpal and body of the hamate.  The wires were then bent and cut.  They were dressed with Xeroform.  The hand was then dressed with folded Kerlix, cast padding, and a well-padded ulnar gutter splint was applied.  Patient was then reversed from anesthesia and extubated uneventfully.  He was transferred to the postoperative bed.  All counts were correct x2 at the end of the procedure.  The patient was then taken to PACU in stable condition.  POSTOPERATIVE PLAN: He will be discharged home with appropriate pain medication and discharge instructions.  I will see him back in the office in 10 to 14 days for his first postop visit with repeat x-rays.  Audria Nine, MD 3:21 PM.

## 2021-07-28 NOTE — Anesthesia Postprocedure Evaluation (Signed)
Anesthesia Post Note  Patient: Shawn Wells  Procedure(s) Performed: RIGHT 5TH CARPOMETACARPAL CLOSED REDUCTION  WITH PERCUTANEOUS PINNING (Right: Finger)     Patient location during evaluation: PACU Anesthesia Type: General Level of consciousness: awake and alert and oriented Pain management: pain level controlled Vital Signs Assessment: post-procedure vital signs reviewed and stable Respiratory status: spontaneous breathing, nonlabored ventilation and respiratory function stable Cardiovascular status: blood pressure returned to baseline and stable Postop Assessment: no apparent nausea or vomiting Anesthetic complications: no   No notable events documented.  Last Vitals:  Vitals:   07/28/21 1545 07/28/21 1600  BP: 112/79 115/78  Pulse: (!) 59 (!) 49  Resp: 13 13  Temp:    SpO2: 100% 100%    Last Pain:  Vitals:   07/28/21 1600  TempSrc:   PainSc: 7                  Jenilyn Magana A.

## 2021-07-29 ENCOUNTER — Encounter (HOSPITAL_BASED_OUTPATIENT_CLINIC_OR_DEPARTMENT_OTHER): Payer: Self-pay | Admitting: Orthopedic Surgery

## 2021-07-29 ENCOUNTER — Telehealth: Payer: Self-pay

## 2021-07-29 NOTE — Telephone Encounter (Signed)
Contacted patient and he had concerns of if he could loosen his ace bandage. Informed patient that he was able to loosen his bandage however he is not able to remove ortho-splint. Post op appointment was also made during this conversation.

## 2021-07-29 NOTE — Telephone Encounter (Signed)
Pt lvm on triage phone stating he has some postop questions. Would like a cb  Cb # [phone]

## 2021-08-09 ENCOUNTER — Ambulatory Visit (INDEPENDENT_AMBULATORY_CARE_PROVIDER_SITE_OTHER): Payer: Self-pay | Admitting: Orthopedic Surgery

## 2021-08-09 ENCOUNTER — Ambulatory Visit (INDEPENDENT_AMBULATORY_CARE_PROVIDER_SITE_OTHER): Payer: Commercial Managed Care - HMO

## 2021-08-09 DIAGNOSIS — S63054A Dislocation of other carpometacarpal joint of right hand, initial encounter: Secondary | ICD-10-CM

## 2021-08-09 NOTE — [place] (Signed)
   Post-Op Visit Note   Patient: Shawn Wells           Date of Birth: [birth year]           MRN: [id number] Visit Date: 08/09/2021 PCP: Bing Neighbors, FNP   Assessment & Plan:  Chief Complaint:  Chief Complaint  Patient presents with   Right Little Finger - Routine Post Op   Visit Diagnoses:  1. CMC (carpometacarpal joint) dislocation, right, initial encounter     Plan: Patient is 12 days s/p CRPP of right 5th CMC fracture dislocation.  Repeat x-rays today show maintained alignment of the [facility] joint.  The pins are unchanged in position. He will see hand therapy to have a thermoplast splint made.  I can see him back in a few weeks at which time we can get a repeat x-ray and likely remove the pins.   Follow-Up Instructions: No follow-ups on file.   Orders:  Orders Placed This Encounter  Procedures   XR Hand Complete Right   Ambulatory referral to Occupational Therapy   No orders of the defined types were placed in this encounter.   Imaging: No results found.  PMFS History: Patient Active Problem List   Diagnosis Date Noted   Closed traumatic dislocation of carpometacarpal [facility]) joint of right wrist 07/23/2021   TBI (traumatic brain injury) (HCC) 08/22/2015   Past Medical History:  Diagnosis Date   Hypoglycemia    2012   TBI (traumatic brain injury) (HCC) 08/22/2015   no defecits    Family History  Problem Relation Age of Onset   Other Mother        no health conditions    Other Father        no health conditions    Past Surgical History:  Procedure Laterality Date   CLOSED REDUCTION FINGER WITH PERCUTANEOUS PINNING Right 07/28/2021   Procedure: RIGHT 5TH CARPOMETACARPAL CLOSED REDUCTION  WITH PERCUTANEOUS PINNING;  Surgeon: Marlyne Beards, MD;  Location: [facility];  Service: Orthopedics;  Laterality: Right;   Social History   Occupational History   Not on file  Tobacco Use   Smoking status: Some Days    Packs/day: 0.25     Types: Cigarettes   Smokeless tobacco: Never  Vaping Use   Vaping Use: Never used  Substance and Sexual Activity   Alcohol use: Yes    Comment: occasionally   Drug use: Yes    Types: Marijuana    Comment: used yesterday, uses every day   Sexual activity: Not on file

## 2021-08-13 ENCOUNTER — Encounter: Payer: Self-pay | Admitting: Rehabilitative and Restorative Service Providers"

## 2021-08-16 ENCOUNTER — Other Ambulatory Visit: Payer: Self-pay

## 2021-08-16 ENCOUNTER — Ambulatory Visit (INDEPENDENT_AMBULATORY_CARE_PROVIDER_SITE_OTHER): Payer: Self-pay | Admitting: Rehabilitative and Restorative Service Providers"

## 2021-08-16 ENCOUNTER — Encounter: Payer: Self-pay | Admitting: Rehabilitative and Restorative Service Providers"

## 2021-08-16 DIAGNOSIS — M25641 Stiffness of right hand, not elsewhere classified: Secondary | ICD-10-CM

## 2021-08-16 DIAGNOSIS — M25531 Pain in right wrist: Secondary | ICD-10-CM

## 2021-08-16 DIAGNOSIS — R278 Other lack of coordination: Secondary | ICD-10-CM

## 2021-08-16 DIAGNOSIS — R600 Localized edema: Secondary | ICD-10-CM

## 2021-08-16 DIAGNOSIS — M6281 Muscle weakness (generalized): Secondary | ICD-10-CM

## 2021-08-16 DIAGNOSIS — M25631 Stiffness of right wrist, not elsewhere classified: Secondary | ICD-10-CM

## 2021-08-16 NOTE — Therapy (Signed)
OUTPATIENT OCCUPATIONAL THERAPY ORTHO EVALUATION  Patient Name: Shawn Wells MRN: [id number] DOB:30-Jun-1990, 31 y.o., male Today's Date: 08/16/2021  PCP: Lavell Anchors, FNP REFERRING PROVIDER: Dr. Sherilyn Cooter    OT End of Session - 08/16/21 1245     Visit Number 1    Number of Visits 14    Date for OT Re-Evaluation 10/08/21    OT Start Time 1150    OT Stop Time 1247    OT Time Calculation (min) 57 min    Equipment Utilized During Treatment orthotic materials    Activity Tolerance Patient tolerated treatment well;No increased pain;Patient limited by fatigue;Patient limited by pain    Behavior During Therapy [facility] for tasks assessed/performed             Past Medical History:  Diagnosis Date   Hypoglycemia    2012   TBI (traumatic brain injury) ([place]) 08/22/2015   no defecits   Past Surgical History:  Procedure Laterality Date   CLOSED REDUCTION FINGER WITH PERCUTANEOUS PINNING Right 07/28/2021   Procedure: RIGHT 5TH CARPOMETACARPAL CLOSED REDUCTION  WITH PERCUTANEOUS PINNING;  Surgeon: Sherilyn Cooter, MD;  Location: [place];  Service: Orthopedics;  Laterality: Right;   Patient Active Problem List   Diagnosis Date Noted   Closed traumatic dislocation of carpometacarpal [facility]) joint of right wrist 07/23/2021   TBI (traumatic brain injury) ([place]) 08/22/2015    ONSET DATE: DOS 07/28/21  REFERRING DIAG: D92.426S (ICD-10-CM) - CMC (carpometacarpal joint) dislocation, right, initial encounter  THERAPY DIAG:  Localized edema  Muscle weakness (generalized)  Other lack of coordination  Pain in right wrist  Stiffness of right wrist, not elsewhere classified  Stiffness of right hand, not elsewhere classified  Rationale for Evaluation and Treatment Rehabilitation  SUBJECTIVE:   SUBJECTIVE STATEMENT: He is a cook at [employer], has to cut meat on chopping boards. He states having some pain where pin is poking skin. Arrives not wearing rigid  brace because he said it hurt him. He is with SO today.   PERTINENT HISTORY: Per MD: "Right 5th CMC fracture dislocation s/p CRPP.  Thermoplast splint."  PRECAUTIONS: 2+ weeks post op now, at 3-4 weeks, AROM at wrist if MD allows, 5 weeks AAROM & dynamics ok (if MD allows), PROM at 6 weeks, and start hand PRE, wrist PRE at 8 weeks, return to normal 10-12 weeks   WEIGHT BEARING RESTRICTIONS Yes NWB in right hand now  PAIN:  Are you having pain? Yes Rating: 3-4/10 at rest now at pin sites   FALLS: Has patient fallen in last 6 months? No  LIVING ENVIRONMENT: Lives with:  SO   PLOF: Independent  PATIENT GOALS "to get hand working better and less pain"    OBJECTIVE:   HAND DOMINANCE: Right   ADLs: Overall ADLs: States decreased ability to grab, hold household objects, pain and inability to open containers, perform all FMS tasks, etc.    FUNCTIONAL OUTCOME MEASURES: Eval: Patient Specific Functional Scale: 2.3 (cutting meat, shooting b-ball, wash up)  UPPER EXTREMITY ROM    Eval: overtly stiff and decreased finger motion, unsafe for wrist motion now and decreased strength in hand and wrist. Detailed measures will be taken when more time and appropriate.    Active ROM Right eval  Wrist flexion   Wrist extension   Wrist ulnar deviation   Wrist radial deviation   Wrist pronation   Wrist supination   (Blank rows = not tested)  Active ROM Right TBD  Thumb MCP (  0-60)   Thumb IP (0-80)   Thumb Radial abd/add (0-55)   Thumb Palmar abd/add (0-45)   Thumb Opposition to Small Finger   Index MCP (0-90)   Index PIP (0-100)   Index DIP (0-70)    Long MCP (0-90)    Long PIP (0-100)    Long DIP (0-70)    Ring MCP (0-90)    Ring PIP (0-100)    Ring DIP (0-70)    Little MCP (0-90)    Little PIP (0-100)    Little DIP (0-70)    (Blank rows = not tested)   UPPER EXTREMITY MMT:     MMT Right eval  Wrist flexion   Wrist extension   Wrist ulnar deviation   Wrist radial  deviation   Wrist pronation   Wrist supination   (Blank rows = not tested)  HAND FUNCTION: Eval: Overt weakness at evla, unsafe, TBD: Grip strength Right: TBD lbs, Left: TBD lbs   COORDINATION: Eval: Overt poor coordination and stiffness  Box and Blocks Test: TBD Blocks today (TBD is [facility]); 9 Hole Peg Test Right: TBDsec, Left: TBD sec (TBD sec is WFL)   SENSATION: Eval:  Light touch intact today, though diminished around sx area    EDEMA:   Eval:  Mildly swollen in hand and wrist today  COGNITION: Overall cognitive status: WFL for evaluation today   OBSERVATIONS:   Eval: He had a pin digging into his swollen hand, with OT was able to adjust and move out of his skin before cleaning hand with alcohol and re-wrapping. He was cautioned for NWB.    TODAY'S TREATMENT:  Eval: OT edu on self-care pin site care and cleans and wraps k-wires. He states understanding cleaning at least 1 x day but NWB, no functional use of hand yet. (He indicates he's been doing some weightbearing and having some pain already.)   Custom orthotic fabrication was indicated due to pt's healing wrist fractures and need for safe, functional positioning. OT fabricated custom ulnar gutter tight fitting orthotic for pt today to immobilize wrist and allow healing. It fit well with no areas of pressure, pt states a comfortable fit. Pt was educated on the wearing schedule, to call or come in ASAP if it is causing any irritation or is not achieving desired function. It will be checked/adjusted in upcoming sessions, as needed. Pt states understanding.   OT also quickly edu on HEP for moving shoulder, elbow, and fingers within limit of orthosis. He states understanding.    PATIENT EDUCATION: Education details: See tx section above for details  Person educated: Patient Education method: Verbal Instruction, Teach back, Handouts  Education comprehension: States and demonstrates understanding, Additional Education required     HOME EXERCISE PROGRAM: See tx section above for details   GOALS: Goals reviewed with patient? Yes   SHORT TERM GOALS: (STG required if POC>30 days)  Pt will obtain protective, custom orthotic. Target date: 08/16/21 Goal status: MET  2.  Pt will demo/state understanding of initial HEP to improve pain levels and prerequisite motion. Target date: 09/03/21 Goal status: INITIAL   LONG TERM GOALS:  Pt will improve functional ability by decreased impairment per Quick DASH assessment from 2.3 to 7.5 or better, for better quality of life. Target date: 10/08/21 Goal status: INITIAL  2.  Pt will improve grip strength in right to at least 55lbs for functional use at home and in IADLs. Target date: 10/08/21 Goal status: INITIAL  3.  Pt will improve  A/ROM in right wrist flex, ext to at least 50* each, to have functional motion for tasks like reach and grasp.  Target date: 10/08/21 Goal status: INITIAL  4.  Pt will improve strength in right wrist to at least 4+/5 MMT to have increased functional ability to carry out selfcare and higher-level homecare tasks with no difficulty. Target date: 10/08/21 Goal status: INITIAL   ASSESSMENT:  CLINICAL IMPRESSION: Patient is a 31 y.o. male who was seen today for occupational therapy evaluation for broken right wrist at 5th and likely 4th CMC Js and subsequent k-wires, healing, pain and decreased functional ability.  He has been non-compliant with splinting and NWB so far, and is somewhat at risk for poor outcome.   PERFORMANCE DEFICITS in functional skills including ADLs, IADLs, coordination, dexterity, proprioception, edema, ROM, strength, pain, fascial restrictions, flexibility, FMC, GMC, body mechanics, endurance, decreased knowledge of precautions, wound, skin integrity, and UE functional use, cognitive skills including problem solving and safety awareness, and psychosocial skills including coping strategies, environmental adaptation, habits, and  routines and behaviors.   IMPAIRMENTS are limiting patient from ADLs, IADLs, work, play, leisure, and social participation.   COMORBIDITIES may have co-morbidities  that affects occupational performance. Patient will benefit from skilled OT to address above impairments and improve overall function.  MODIFICATION OR ASSISTANCE TO COMPLETE EVALUATION: No modification of tasks or assist necessary to complete an evaluation.  OT OCCUPATIONAL PROFILE AND HISTORY: Problem focused assessment: Including review of records relating to presenting problem.  CLINICAL DECISION MAKING: Moderate - several treatment options, min-mod task modification necessary  REHAB POTENTIAL: Good  EVALUATION COMPLEXITY: Low      PLAN: OT FREQUENCY: 1-2x/week  OT DURATION: 8 weeks (through 10/08/21)  PLANNED INTERVENTIONS: self care/ADL training, therapeutic exercise, therapeutic activity, neuromuscular re-education, manual therapy, scar mobilization, passive range of motion, splinting, ultrasound, fluidotherapy, compression bandaging, moist heat, cryotherapy, contrast bath, patient/family education, and coping strategies training  RECOMMENDED OTHER SERVICES: none now   CONSULTED AND AGREED WITH PLAN OF CARE: Patient  PLAN FOR NEXT SESSION: Check orthotic and initial HEP, upgrade as appropriate    Benito Mccreedy, OTR/L, CHT 08/16/2021, 6:13 PM

## 2021-08-18 ENCOUNTER — Emergency Department (HOSPITAL_COMMUNITY)
Admission: EM | Admit: 2021-08-18 | Discharge: 2021-08-18 | Disposition: A | Discharge status: Home / Self Care | Payer: Self-pay | Attending: Emergency Medicine | Admitting: Emergency Medicine

## 2021-08-18 ENCOUNTER — Encounter (HOSPITAL_COMMUNITY): Payer: Self-pay | Admitting: Emergency Medicine

## 2021-08-18 DIAGNOSIS — R369 Urethral discharge, unspecified: Secondary | ICD-10-CM | POA: Insufficient documentation

## 2021-08-18 LAB — URINALYSIS, ROUTINE W REFLEX MICROSCOPIC
Bacteria, UA: NONE SEEN
Bilirubin Urine: NEGATIVE
Glucose, UA: NEGATIVE mg/dL
Hgb urine dipstick: NEGATIVE
Ketones, ur: NEGATIVE mg/dL
Nitrite: NEGATIVE
Protein, ur: NEGATIVE mg/dL
Specific Gravity, Urine: 1.013 (ref 1.005–1.030)
WBC, UA: >50 WBC/hpf — ABNORMAL HIGH (ref 0–5)
pH: 6 (ref 5.0–8.0)

## 2021-08-18 LAB — WET PREP, GENITAL
Clue Cells Wet Prep HPF POC: NONE SEEN
Sperm: NONE SEEN
Trich, Wet Prep: NONE SEEN
WBC, Wet Prep HPF POC: >=10 — AB (ref <10)
Yeast Wet Prep HPF POC: NONE SEEN

## 2021-08-18 MED ORDER — LIDOCAINE HCL (PF) 1 % IJ SOLN
1.0000 mL | Freq: Once | INTRAMUSCULAR | Status: AC
  Administered 2021-08-18: 1 mL
  Filled 2021-08-18: qty 5

## 2021-08-18 MED ORDER — CEFTRIAXONE SODIUM 500 MG IJ SOLR
500.0000 mg | Freq: Once | INTRAMUSCULAR | Status: AC
  Administered 2021-08-18: 500 mg via INTRAMUSCULAR
  Filled 2021-08-18: qty 500

## 2021-08-18 MED ORDER — DOXYCYCLINE HYCLATE 100 MG PO TABS: 100.0000 mg | ORAL_TABLET | Freq: Two times a day (BID) | ORAL | 0 refills | Status: DC

## 2021-08-18 NOTE — ED Notes (Signed)
Pt verbalizes understanding of discharge instructions. Opportunity for questions and answers were provided. Pt discharged from the ED.   ?

## 2021-08-18 NOTE — Discharge Instructions (Signed)
You were given 1 antibiotic in the emergency department.  A prescription for a second antibiotic was sent to your pharmacy.  Take this as prescribed for the next 7 days.  The gonorrhea and Chlamydia testing result will be available within the next 2 days.  Follow-up on MyChart for confirmation of infection.  Inform any recent sexual partners.  Avoid unprotected sexual activity until you complete antibiotic therapy and have resolved symptoms.  Additionally, there is a telephone number below that you can call to set up a urology appointment to discuss options for removal of warts.  Return to the ED for any new or worsening symptoms of concern.

## 2021-08-18 NOTE — ED Triage Notes (Signed)
Patient here with compliant of painless penile discharge that he first noticed two days ago. Patient is alert, oriented, ambulatory, and in no apparent distress at this time.

## 2021-08-18 NOTE — ED Provider Notes (Signed)
[facility] EMERGENCY DEPARTMENT Provider Note   CSN: [id number] Arrival date & time: 08/18/21  0703     History  Chief Complaint  Patient presents with   Penile Discharge    Shawn Wells is a 31 y.o. male.   Penile Discharge  Patient presents for penile discharge.  Medical history includes TBI.  Penile discharge has been present for the past 2 to 3 days.  He describes it as clear-cloudy in color.  He has not had any associated pain or itching.  He denies any dysuria or systemic symptoms.  He has not noticed any new skin lesions.     Home Medications Prior to Admission medications   Medication Sig Start Date End Date Taking? Authorizing Provider  doxycycline (VIBRA-TABS) 100 MG tablet Take 1 tablet (100 mg total) by mouth 2 (two) times daily. 08/18/21  Yes Gloris Manchester, MD  acetaminophen (TYLENOL) 325 MG tablet Take 2 tablets (650 mg total) by mouth every 6 (six) hours as needed for up to 30 doses for moderate pain or mild pain. 07/21/21   Terald Sleeper, MD  albuterol (VENTOLIN HFA) 108 (90 Base) MCG/ACT inhaler Inhale 1-2 puffs into the lungs every 6 (six) hours as needed for wheezing or shortness of breath. 02/27/20   Wallis Bamberg, PA-C      Allergies    Nickel    Review of Systems   Review of Systems  Genitourinary:  Positive for penile discharge.  All other systems reviewed and are negative.   Physical Exam Updated Vital Signs BP 120/78 (BP Location: Left Arm)   Pulse 76   Temp 97.6 F (36.4 C) (Oral)   Resp 15   SpO2 100%  Physical Exam Vitals and nursing note reviewed. Exam conducted with a chaperone present.  Constitutional:      General: He is not in acute distress.    Appearance: Normal appearance. He is well-developed and normal weight. He is not ill-appearing, toxic-appearing or diaphoretic.  HENT:     Head: Normocephalic and atraumatic.     Right Ear: External ear normal.     Left Ear: External ear normal.     Nose: Nose normal.   Eyes:     Extraocular Movements: Extraocular movements intact.     Conjunctiva/sclera: Conjunctivae normal.  Cardiovascular:     Rate and Rhythm: Normal rate and regular rhythm.  Pulmonary:     Effort: Pulmonary effort is normal. No respiratory distress.  Abdominal:     General: There is no distension.     Palpations: Abdomen is soft.  Genitourinary:    Comments: Purulent discharge is able to be expressed from urethral meatus.  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  No lymphadenopathy.  No testicular swelling. Musculoskeletal:        General: No swelling. Normal range of motion.     Cervical back: Normal range of motion and neck supple.     Right lower leg: No edema.     Left lower leg: No edema.  Skin:    General: Skin is warm and dry.     Coloration: Skin is not jaundiced or pale.  Neurological:     General: No focal deficit present.     Mental Status: He is alert and oriented to person, place, and time.     Cranial Nerves: No cranial nerve deficit.     Sensory: No sensory deficit.     Motor: No weakness.  Coordination: Coordination normal.  Psychiatric:        Mood and Affect: Mood normal.        Behavior: Behavior normal.        Thought Content: Thought content normal.        Judgment: Judgment normal.     ED Results / Procedures / Treatments   Labs (all labs ordered are listed, but only abnormal results are displayed) Labs Reviewed  WET PREP, GENITAL - Abnormal; Notable for the following components:      Result Value   WBC, Wet Prep HPF POC >=10 (*)    All other components within normal limits  URINALYSIS, ROUTINE W REFLEX MICROSCOPIC - Abnormal; Notable for the following components:   APPearance HAZY (*)    Leukocytes,Ua MODERATE (*)    WBC, UA >50 (*)    All other components within normal limits  GC/CHLAMYDIA PROBE AMP ([place]) NOT AT [facility]    EKG None  Radiology No results found.  Procedures Procedures     Medications Ordered in ED Medications  cefTRIAXone (ROCEPHIN) injection 500 mg (500 mg Intramuscular Given 08/18/21 0841)  lidocaine (PF) (XYLOCAINE) 1 % injection 1-2.1 mL (1 mL Other Given 08/18/21 [id number])    ED Course/ Medical Decision Making/ A&P                           Medical Decision Making Amount and/or Complexity of Data Reviewed Labs: ordered.  Risk Prescription drug management.   This patient presents to the ED for concern of penile discharge, this involves an extensive number of treatment options, and is a complaint that carries with it a high risk of complications and morbidity.  The differential diagnosis includes gonorrhea, chlamydia, trichomonas   Co morbidities that complicate the patient evaluation  TBI   Additional history obtained:  Additional history obtained from N/A External records from outside source obtained and reviewed including EMR   Lab Tests:  I Ordered, and personally interpreted labs.  The pertinent results include: Wet prep negative for trichomonas   Problem List / ED Course / Critical interventions / Medication management  Healthy 31 year old male presenting for 2 to 3 days of penile discharge.  Vital signs are normal on arrival.  Patient is well-appearing on exam.  He is able to express a purulent discharge from his urethral meatus.  This discharge was swabbed for GC/chlamydia testing as well as wet prep.  Patient declined HIV testing.  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  He states that these have been there for over 10 years.  Patient was treated empirically for gonorrhea/chlamydia.  Wet prep showed WBCs only.  He was given contact information for urology follow-up for removal of condyloma.  He was advised to inform sexual partners and to abstain from intercourse until infection has cleared.  Patient was discharged in good condition. I ordered medication including ceftriaxone for empiric treatment of STI Reevaluation of  the patient after these medicines showed that the patient stayed the same I have reviewed the patients home medicines and have made adjustments as needed   Social Determinants of Health:  Does not see PCP         Final Clinical Impression(s) / ED Diagnoses Final diagnoses:  Penile discharge    Rx / DC Orders ED Discharge Orders          Ordered    doxycycline (VIBRA-TABS) 100 MG tablet  2 times daily  08/18/21 6468              Gloris Manchester, MD 08/18/21 1001

## 2021-08-19 LAB — GC/CHLAMYDIA PROBE AMP (~~LOC~~) NOT AT ARMC
Chlamydia: NEGATIVE
Comment: NEGATIVE
Comment: NORMAL
Neisseria Gonorrhea: POSITIVE — AB

## 2021-08-24 ENCOUNTER — Ambulatory Visit (INDEPENDENT_AMBULATORY_CARE_PROVIDER_SITE_OTHER): Payer: Self-pay | Admitting: Orthopedic Surgery

## 2021-08-24 ENCOUNTER — Ambulatory Visit: Payer: Self-pay

## 2021-08-24 DIAGNOSIS — S63054A Dislocation of other carpometacarpal joint of right hand, initial encounter: Secondary | ICD-10-CM

## 2021-08-24 NOTE — Progress Notes (Signed)
   Post-Op Visit Note   Patient: Shawn Wells           Date of Birth: [birth year]           MRN: [id number] Visit Date: 08/24/2021 PCP: Bing Neighbors, FNP   Assessment & Plan:  Chief Complaint:  Chief Complaint  Patient presents with   Right Thumb - Routine Post Op   Visit Diagnoses:  1. CMC (carpometacarpal joint) dislocation, right, initial encounter     Plan: Patient is 4 weeks s/p CRPP right 5th CMC fracture dislocation.  He is doing well today.  The pin sites are clean and dry.  X-rays show maintained reduction of the 5th [facility] joint with interval healing of the intra-articular 5th MC base fracture. The k-wires were removed today.  He is seeing therapy tomorrow.  I can see him back in another 3 weeks or so with a repeat x-ray.   Follow-Up Instructions: No follow-ups on file.   Orders:  Orders Placed This Encounter  Procedures   XR Finger Thumb Right   No orders of the defined types were placed in this encounter.   Imaging: No results found.  PMFS History: Patient Active Problem List   Diagnosis Date Noted   Closed traumatic dislocation of carpometacarpal [facility]) joint of right wrist 07/23/2021   TBI (traumatic brain injury) ([facility]) 08/22/2015   Past Medical History:  Diagnosis Date   Hypoglycemia    2012   TBI (traumatic brain injury) ([facility]) 08/22/2015   no defecits    Family History  Problem Relation Age of Onset   Other Mother        no health conditions    Other Father        no health conditions    Past Surgical History:  Procedure Laterality Date   CLOSED REDUCTION FINGER WITH PERCUTANEOUS PINNING Right 07/28/2021   Procedure: RIGHT 5TH CARPOMETACARPAL CLOSED REDUCTION  WITH PERCUTANEOUS PINNING;  Surgeon: Marlyne Beards, MD;  Location: [facility];  Service: Orthopedics;  Laterality: Right;   Social History   Occupational History   Not on file  Tobacco Use   Smoking status: Some Days    Packs/day: 0.25    Types: Cigarettes    Smokeless tobacco: Never  Vaping Use   Vaping Use: Never used  Substance and Sexual Activity   Alcohol use: Yes    Comment: occasionally   Drug use: Yes    Types: Marijuana    Comment: used yesterday, uses every day   Sexual activity: Not on file

## 2021-08-25 ENCOUNTER — Encounter: Payer: Self-pay | Admitting: Rehabilitative and Restorative Service Providers"

## 2021-08-25 ENCOUNTER — Telehealth: Payer: Self-pay | Admitting: Rehabilitative and Restorative Service Providers"

## 2021-08-25 NOTE — Telephone Encounter (Signed)
OT called patient to discuss missed appointment. OT spoke with pt about calling to schedule new appointment for guidance in post op rehab.  OT asks him to wear brace ,not lift anything heavy, return to therapy ASAP.  He states he will call in to make new appt. Future missed appointments or lack of follow-up could lead to early discharge from therapy.

## 2021-09-06 ENCOUNTER — Emergency Department (HOSPITAL_BASED_OUTPATIENT_CLINIC_OR_DEPARTMENT_OTHER): Payer: Self-pay | Admitting: Radiology

## 2021-09-06 ENCOUNTER — Encounter (HOSPITAL_BASED_OUTPATIENT_CLINIC_OR_DEPARTMENT_OTHER): Payer: Self-pay

## 2021-09-06 ENCOUNTER — Other Ambulatory Visit: Payer: Self-pay

## 2021-09-06 DIAGNOSIS — M79644 Pain in right finger(s): Secondary | ICD-10-CM | POA: Insufficient documentation

## 2021-09-06 DIAGNOSIS — Z5321 Procedure and treatment not carried out due to patient leaving prior to being seen by health care provider: Secondary | ICD-10-CM | POA: Insufficient documentation

## 2021-09-06 NOTE — ED Triage Notes (Signed)
Patient here POV from Home.  Endorses Right Fifth Digit Pain related to Fracture sustained Last Month. Had Surgical Repair and Pins removed 13 Days ago. Endorses Pain that has worsened since and Patient is concerned for New Fractures or Complications.  No Recalled Trauma or Injury.   NAD Noted during Triage. A&Ox4. GCS 15. Ambulatory.

## 2021-09-07 ENCOUNTER — Emergency Department (HOSPITAL_BASED_OUTPATIENT_CLINIC_OR_DEPARTMENT_OTHER)
Admission: EM | Admit: 2021-09-07 | Discharge: 2021-09-07 | Discharge status: Against Medical Advice | Payer: Self-pay | Attending: Emergency Medicine | Admitting: Emergency Medicine

## 2021-09-14 ENCOUNTER — Encounter: Payer: Self-pay | Admitting: Orthopedic Surgery

## 2022-01-20 ENCOUNTER — Emergency Department (HOSPITAL_COMMUNITY)
Admission: EM | Admit: 2022-01-20 | Discharge: 2022-01-20 | Disposition: A | Discharge status: Home / Self Care | Payer: Commercial Managed Care - HMO | Attending: Emergency Medicine | Admitting: Emergency Medicine

## 2022-01-20 ENCOUNTER — Other Ambulatory Visit: Payer: Self-pay

## 2022-01-20 DIAGNOSIS — R300 Dysuria: Secondary | ICD-10-CM | POA: Insufficient documentation

## 2022-01-20 DIAGNOSIS — R369 Urethral discharge, unspecified: Secondary | ICD-10-CM | POA: Diagnosis present

## 2022-01-20 DIAGNOSIS — F17210 Nicotine dependence, cigarettes, uncomplicated: Secondary | ICD-10-CM | POA: Insufficient documentation

## 2022-01-20 LAB — URINALYSIS, ROUTINE W REFLEX MICROSCOPIC
Bilirubin Urine: NEGATIVE
Glucose, UA: NEGATIVE mg/dL
Hgb urine dipstick: NEGATIVE
Ketones, ur: NEGATIVE mg/dL
Nitrite: NEGATIVE
Protein, ur: 30 mg/dL — AB
Specific Gravity, Urine: 1.024 (ref 1.005–1.030)
WBC, UA: >50 WBC/hpf — ABNORMAL HIGH (ref 0–5)
pH: 6 (ref 5.0–8.0)

## 2022-01-20 LAB — RPR: RPR Ser Ql: NONREACTIVE

## 2022-01-20 LAB — HIV ANTIBODY (ROUTINE TESTING W REFLEX): HIV Screen 4th Generation wRfx: NONREACTIVE

## 2022-01-20 MED ORDER — METRONIDAZOLE 500 MG PO TABS
2000.0000 mg | ORAL_TABLET | Freq: Once | ORAL | Status: AC
  Administered 2022-01-20: 2000 mg via ORAL
  Filled 2022-01-20: qty 4

## 2022-01-20 MED ORDER — METRONIDAZOLE 500 MG PO TABS: 500.0000 mg | ORAL_TABLET | Freq: Two times a day (BID) | ORAL | 0 refills | Status: DC

## 2022-01-20 MED ORDER — LIDOCAINE HCL (PF) 1 % IJ SOLN
INTRAMUSCULAR | Status: AC
  Filled 2022-01-20: qty 30

## 2022-01-20 MED ORDER — DOXYCYCLINE HYCLATE 100 MG PO TABS: 100.0000 mg | ORAL_TABLET | Freq: Two times a day (BID) | ORAL | 0 refills | Status: DC

## 2022-01-20 MED ORDER — CEFTRIAXONE SODIUM 500 MG IJ SOLR
500.0000 mg | Freq: Once | INTRAMUSCULAR | Status: AC
  Administered 2022-01-20: 500 mg via INTRAMUSCULAR
  Filled 2022-01-20: qty 500

## 2022-01-20 NOTE — ED Provider Notes (Signed)
[facility] EMERGENCY DEPARTMENT Provider Note  CSN: [id number] Arrival date & time: 01/20/22 1031  Chief Complaint(s) Penile Discharge, Dysuria, and Exposure to STD  HPI Shawn Wells is a 31 y.o. male presenting with urethral discharge.  He reports urethral discharge for around 1 week.  He reports mild dysuria.  Reports urethral discharge is white.  He recently had a new sexual partner who informed him a few days ago that she tested positive for gonorrhea, chlamydia, and trichomoniasis.  He denies any rectal pain, joint pain, fevers or chills, abdominal pain, chest pain or any other symptoms.  He denies any testicular pain.   Past Medical History Past Medical History:  Diagnosis Date   Hypoglycemia    2012   TBI (traumatic brain injury) ([place]) 08/22/2015   no defecits   Patient Active Problem List   Diagnosis Date Noted   Closed traumatic dislocation of carpometacarpal [facility]) joint of right wrist 07/23/2021   TBI (traumatic brain injury) ([place]) 08/22/2015   Home Medication(s) Prior to Admission medications   Medication Sig Start Date End Date Taking? Authorizing Provider  acetaminophen (TYLENOL) 325 MG tablet Take 2 tablets (650 mg total) by mouth every 6 (six) hours as needed for up to 30 doses for moderate pain or mild pain. 07/21/21   Wyvonnia Dusky, MD  albuterol (VENTOLIN HFA) 108 (90 Base) MCG/ACT inhaler Inhale 1-2 puffs into the lungs every 6 (six) hours as needed for wheezing or shortness of breath. 02/27/20   Jaynee Eagles, PA-C  doxycycline (VIBRA-TABS) 100 MG tablet Take 1 tablet (100 mg total) by mouth 2 (two) times daily. 01/20/22   Cristie Hem, MD                                                                                                                                    Past Surgical History Past Surgical History:  Procedure Laterality Date   CLOSED REDUCTION FINGER WITH PERCUTANEOUS PINNING Right 07/28/2021   Procedure: RIGHT 5TH  CARPOMETACARPAL CLOSED REDUCTION  WITH PERCUTANEOUS PINNING;  Surgeon: Sherilyn Cooter, MD;  Location: [facility];  Service: Orthopedics;  Laterality: Right;   Family History Family History  Problem Relation Age of Onset   Other Mother        no health conditions    Other Father        no health conditions    Social History Social History   Tobacco Use   Smoking status: Every Day    Packs/day: 0.25    Types: Cigarettes   Smokeless tobacco: Never  Vaping Use   Vaping Use: Never used  Substance Use Topics   Alcohol use: Yes    Comment: occasionally   Drug use: Yes    Types: Marijuana    Comment: used yesterday, uses every day   Allergies Nickel  Review of Systems Review of Systems  All other systems reviewed and are  negative.   Physical Exam Vital Signs  I have reviewed the triage vital signs BP 130/77 (BP Location: Right Arm)   Pulse 63   Temp 98.4 F (36.9 C) (Oral)   Resp 18   Ht 6\' 2"  (1.88 m)   Wt 71.7 kg   SpO2 100%   BMI 20.29 kg/m  Physical Exam Vitals and nursing note reviewed.  Constitutional:      General: He is not in acute distress.    Appearance: Normal appearance.  HENT:     Head: Normocephalic and atraumatic.     Mouth/Throat:     Mouth: Mucous membranes are moist.  Eyes:     Conjunctiva/sclera: Conjunctivae normal.  Cardiovascular:     Rate and Rhythm: Normal rate.  Pulmonary:     Effort: Pulmonary effort is normal. No respiratory distress.  Abdominal:     General: Abdomen is flat.  Skin:    General: Skin is warm and dry.     Capillary Refill: Capillary refill takes less than 2 seconds.  Neurological:     General: No focal deficit present.     Mental Status: He is alert. Mental status is at baseline.  Psychiatric:        Mood and Affect: Mood normal.        Behavior: Behavior normal.     ED Results and Treatments Labs (all labs ordered are listed, but only abnormal results are displayed) Labs Reviewed   URINALYSIS, ROUTINE W REFLEX MICROSCOPIC - Abnormal; Notable for the following components:      Result Value   APPearance CLOUDY (*)    Protein, ur 30 (*)    Leukocytes,Ua LARGE (*)    WBC, UA >50 (*)    Bacteria, UA FEW (*)    All other components within normal limits  HIV ANTIBODY (ROUTINE TESTING W REFLEX)  RPR  GC/CHLAMYDIA PROBE AMP ([place]) NOT AT [facility]                                                                                                                          Radiology No results found.  Pertinent labs & imaging results that were available during my care of the patient were reviewed by me and considered in my medical decision making (see MDM for details).  Medications Ordered in ED Medications  cefTRIAXone (ROCEPHIN) injection 500 mg (500 mg Intramuscular Given 01/20/22 1247)  metroNIDAZOLE (FLAGYL) tablet 2,000 mg (2,000 mg Oral Given 01/20/22 1247)  lidocaine (PF) (XYLOCAINE) 1 % injection (  Given 01/20/22 1247)  Procedures Procedures  (including critical care time)  Medical Decision Making / ED Course   MDM:  31 year old male presenting with request for STD treatment.  Patient endorses urethral discharge, urinalysis with large WBCs and bacteria concerning for likely STI in male patient.  Will treat with ceftriaxone, Flagyl to cover for gonorrhea and trichomoniasis in the emergency department.  Will discharge with prescription for doxycycline to treat for chlamydia.  Also send RPR and HIV testing, informed patient that he should look up the results of these in his chart and return if positive.     Lab Tests: -I ordered, reviewed, and interpreted labs.   The pertinent results include:   Labs Reviewed  URINALYSIS, ROUTINE W REFLEX MICROSCOPIC - Abnormal; Notable for the following components:      Result Value    APPearance CLOUDY (*)    Protein, ur 30 (*)    Leukocytes,Ua LARGE (*)    WBC, UA >50 (*)    Bacteria, UA FEW (*)    All other components within normal limits  HIV ANTIBODY (ROUTINE TESTING W REFLEX)  RPR  GC/CHLAMYDIA PROBE AMP () NOT AT [facility]    Notable for pyuria   Medicines ordered and prescription drug management: Meds ordered this encounter  Medications   doxycycline (VIBRA-TABS) 100 MG tablet    Sig: Take 1 tablet (100 mg total) by mouth 2 (two) times daily.    Dispense:  14 tablet    Refill:  0   DISCONTD: metroNIDAZOLE (FLAGYL) 500 MG tablet    Sig: Take 1 tablet (500 mg total) by mouth 2 (two) times daily.    Dispense:  14 tablet    Refill:  0   cefTRIAXone (ROCEPHIN) injection 500 mg    Order Specific Question:   Antibiotic Indication:    Answer:   STD   metroNIDAZOLE (FLAGYL) tablet 2,000 mg   lidocaine (PF) (XYLOCAINE) 1 % injection    Acquanetta Belling, Mandy B: cabinet override    -I have reviewed the patients home medicines and have made adjustments as needed   Co morbidities that complicate the patient evaluation  Past Medical History:  Diagnosis Date   Hypoglycemia    2012   TBI (traumatic brain injury) ([facility]) 08/22/2015   no defecits      Dispostion: Disposition decision including need for hospitalization was considered, and patient discharged from emergency department.    Final Clinical Impression(s) / ED Diagnoses Final diagnoses:  Penile discharge     This chart was dictated using voice recognition software.  Despite best efforts to proofread,  errors can occur which can change the documentation meaning.    Lonell Grandchild, MD 01/20/22 1312

## 2022-01-20 NOTE — ED Triage Notes (Signed)
Pt. Stated, The girl I was with was tested positive for Gonorrheae, Chlamydia, and Trich. Ive had penis discharge and painful peeing for 2 days.

## 2022-01-20 NOTE — ED Provider Triage Note (Addendum)
Emergency Medicine Provider Triage Evaluation Note  Shawn Wells , a 31 y.o. male  was evaluated in triage.  Pt complains of concerns for penile discharge. Notes that his partner noted that they tested positive for gonorrhea, chlamydia, and trich.  Has associated dysuria.  No meds tried.  Denies abdominal pain, nausea, vomiting, back pain, hematuria.  Review of Systems  Positive:  Negative:   Physical Exam  BP 136/84 (BP Location: Right Arm)   Pulse (!) 56   Temp 98.6 F (37 C)   Resp 16   Ht 6\' 2"  (1.88 m)   Wt 71.7 kg   SpO2 100%   BMI 20.29 kg/m  Gen:   Awake, no distress   Resp:  Normal effort  MSK:   Moves extremities without difficulty  Other:  GU exam deferred in triage  Medical Decision Making  Medically screening exam initiated at 11:22 AM.  Appropriate orders placed.  Shawn Wells was informed that the remainder of the evaluation will be completed by another provider, this initial triage assessment does not replace that evaluation, and the importance of remaining in the ED until their evaluation is complete.     Shawn Wells A, PA-C 01/20/22 1129    Shawn Wells A, PA-C 01/20/22 1129

## 2022-01-20 NOTE — Discharge Instructions (Addendum)
We evaluated you for your STI exposure.  We have treated you for gonorrhea with the medicine we gave you in the emergency department.  We also treated you for trichomonas with the oral pill we gave you in the emergency department.  Please take your prescribed doxycycline twice daily for 7 days for chlamydia.  Monitor your other tests for syphilis (RPR), and HIV and return if these are positive.  Please only pick up the doxycycline.  We sent a prescription for Flagyl but we treated you with this in the emergency department and you do not need to pick it up  Please abstain from sexual activity until you complete your treatment.  Please return if you develop any severe symptoms, fevers, joint pain, or any other concerning symptoms.

## 2022-01-21 LAB — GC/CHLAMYDIA PROBE AMP (~~LOC~~) NOT AT ARMC
Chlamydia: NEGATIVE
Comment: NEGATIVE
Comment: NORMAL
Neisseria Gonorrhea: POSITIVE — AB

## 2022-02-09 ENCOUNTER — Other Ambulatory Visit: Payer: Self-pay

## 2022-02-09 ENCOUNTER — Emergency Department (HOSPITAL_COMMUNITY)
Admission: EM | Admit: 2022-02-09 | Discharge: 2022-02-09 | Discharge status: Against Medical Advice | Payer: Commercial Managed Care - HMO

## 2022-02-09 NOTE — ED Notes (Signed)
Called and no answer. Told ED Tech he was leaving

## 2022-02-11 ENCOUNTER — Emergency Department (HOSPITAL_COMMUNITY)
Admission: EM | Admit: 2022-02-11 | Discharge: 2022-02-11 | Disposition: A | Discharge status: Home / Self Care | Payer: Commercial Managed Care - HMO | Attending: Emergency Medicine | Admitting: Emergency Medicine

## 2022-02-11 ENCOUNTER — Ambulatory Visit: Payer: Commercial Managed Care - HMO

## 2022-02-11 ENCOUNTER — Other Ambulatory Visit: Payer: Self-pay

## 2022-02-11 DIAGNOSIS — B079 Viral wart, unspecified: Secondary | ICD-10-CM | POA: Insufficient documentation

## 2022-02-11 DIAGNOSIS — N342 Other urethritis: Secondary | ICD-10-CM

## 2022-02-11 DIAGNOSIS — N341 Nonspecific urethritis: Secondary | ICD-10-CM | POA: Diagnosis not present

## 2022-02-11 DIAGNOSIS — A630 Anogenital (venereal) warts: Secondary | ICD-10-CM

## 2022-02-11 DIAGNOSIS — R369 Urethral discharge, unspecified: Secondary | ICD-10-CM | POA: Diagnosis present

## 2022-02-11 LAB — URINALYSIS, ROUTINE W REFLEX MICROSCOPIC
Bilirubin Urine: NEGATIVE
Glucose, UA: NEGATIVE mg/dL
Hgb urine dipstick: NEGATIVE
Ketones, ur: NEGATIVE mg/dL
Nitrite: NEGATIVE
Protein, ur: 30 mg/dL — AB
Specific Gravity, Urine: 1.027 (ref 1.005–1.030)
WBC, UA: >50 WBC/hpf — ABNORMAL HIGH (ref 0–5)
pH: 6 (ref 5.0–8.0)

## 2022-02-11 LAB — RPR: RPR Ser Ql: NONREACTIVE

## 2022-02-11 LAB — HIV ANTIBODY (ROUTINE TESTING W REFLEX): HIV Screen 4th Generation wRfx: NONREACTIVE

## 2022-02-11 MED ORDER — DOXYCYCLINE HYCLATE 100 MG PO TABS
100.0000 mg | ORAL_TABLET | Freq: Once | ORAL | Status: AC
  Administered 2022-02-11: 100 mg via ORAL
  Filled 2022-02-11: qty 1

## 2022-02-11 MED ORDER — DOXYCYCLINE HYCLATE 100 MG PO CAPS: 100.0000 mg | ORAL_CAPSULE | Freq: Two times a day (BID) | ORAL | 0 refills | Status: DC

## 2022-02-11 MED ORDER — LIDOCAINE HCL (PF) 1 % IJ SOLN
INTRAMUSCULAR | Status: AC
  Administered 2022-02-11: 1 mL
  Filled 2022-02-11: qty 5

## 2022-02-11 MED ORDER — LIDOCAINE HCL (PF) 1 % IJ SOLN: 5.0000 mL | Freq: Once | INTRAMUSCULAR | Status: AC

## 2022-02-11 MED ORDER — CEFTRIAXONE SODIUM 500 MG IJ SOLR
500.0000 mg | Freq: Once | INTRAMUSCULAR | Status: AC
  Administered 2022-02-11: 500 mg via INTRAMUSCULAR
  Filled 2022-02-11: qty 500

## 2022-02-11 NOTE — ED Notes (Addendum)
Seen here 2 weeks ago and dx with STD. Finished antibiotic treatment. Reports no relief and continues to have yellow colored drainage. Denies difficulty urinating. Reports pain only when urinating.

## 2022-02-11 NOTE — ED Triage Notes (Signed)
Pt. Stated, I was treated for STD 2 weeks ago and given medicine and Im still having a discharge.

## 2022-02-11 NOTE — ED Provider Notes (Signed)
[facility] EMERGENCY DEPARTMENT Provider Note   CSN: [id number] Arrival date & time: 02/11/22  0747     History  Chief Complaint  Patient presents with   Penile Discharge   SEXUALLY TRANSMITTED DISEASE   Dysuria    Shawn Wells is a 31 y.o. male.  HPI 31 year old male presents with penile discharge.  He had the symptoms for about 4 days.  These are similar to when he was here earlier in the month and was treated for STI.  He states he completed the course of medicine and felt better for several days but then his symptoms have come back.  He denies any sexual intercourse since though did state he had some oral sex.  He denies systemic symptoms such as fever, joint pain, new rash.  However he is asking me to evaluate a chronic penile rash/lesions that he has had since he was a teenager.  Home Medications Prior to Admission medications   Medication Sig Start Date End Date Taking? Authorizing Provider  doxycycline (VIBRAMYCIN) 100 MG capsule Take 1 capsule (100 mg total) by mouth 2 (two) times daily. 02/11/22  Yes Pricilla Loveless, MD  acetaminophen (TYLENOL) 325 MG tablet Take 2 tablets (650 mg total) by mouth every 6 (six) hours as needed for up to 30 doses for moderate pain or mild pain. 07/21/21   Terald Sleeper, MD  albuterol (VENTOLIN HFA) 108 (90 Base) MCG/ACT inhaler Inhale 1-2 puffs into the lungs every 6 (six) hours as needed for wheezing or shortness of breath. 02/27/20   Wallis Bamberg, PA-C      Allergies    Nickel    Review of Systems   Review of Systems  Genitourinary:  Positive for dysuria and penile discharge. Negative for scrotal swelling and testicular pain.  Skin:  Positive for rash.    Physical Exam Updated Vital Signs BP (!) 140/89 (BP Location: Right Arm)   Pulse (!) 51   Temp 98 F (36.7 C) (Oral)   Resp 16   Ht 6\' 2"  (1.88 m)   Wt 71.7 kg   SpO2 100%   BMI 20.29 kg/m  Physical Exam Vitals and nursing note reviewed. Exam  conducted with a chaperone present.  Constitutional:      Appearance: He is well-developed.  HENT:     Head: Normocephalic and atraumatic.  Pulmonary:     Effort: Pulmonary effort is normal.  Abdominal:     General: There is no distension.  Genitourinary:    Penis: Discharge present. No swelling.      Testes:        Right: Tenderness not present.        Left: Tenderness not present.    Skin:    General: Skin is warm and dry.  Neurological:     Mental Status: He is alert.     ED Results / Procedures / Treatments   Labs (all labs ordered are listed, but only abnormal results are displayed) Labs Reviewed  URINALYSIS, ROUTINE W REFLEX MICROSCOPIC - Abnormal; Notable for the following components:      Result Value   APPearance CLOUDY (*)    Protein, ur 30 (*)    Leukocytes,Ua LARGE (*)    WBC, UA >50 (*)    Bacteria, UA RARE (*)    All other components within normal limits  RPR  HIV ANTIBODY (ROUTINE TESTING W REFLEX)  GC/CHLAMYDIA PROBE AMP ([place]) NOT AT [facility]    EKG None  Radiology No  results found.  Procedures Procedures    Medications Ordered in ED Medications  cefTRIAXone (ROCEPHIN) injection 500 mg (has no administration in time range)  doxycycline (VIBRA-TABS) tablet 100 mg (has no administration in time range)    ED Course/ Medical Decision Making/ A&P                           Medical Decision Making Amount and/or Complexity of Data Reviewed Labs: ordered.  Risk Prescription drug management.   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  He will be given another course of Rocephin/doxycycline.  Given he had relief of his symptoms I suspect that he has a new infection.  However also have recommended he follow-up with infectious disease.  As for the lesions on his penis he has multiple warts.  These are chronic for him but he is asking for someone to potentially treat these and remove  them.  He will be referred to infectious disease and dermatology.  No systemic symptoms to be concerned about disseminated gonorrhea.        Final Clinical Impression(s) / ED Diagnoses Final diagnoses:  Urethritis  Penile wart    Rx / DC Orders ED Discharge Orders          Ordered    doxycycline (VIBRAMYCIN) 100 MG capsule  2 times daily        02/11/22 0848              Pricilla Loveless, MD 02/11/22 [id number]

## 2022-02-11 NOTE — Discharge Instructions (Addendum)
Complete the full course of antibiotics, even if you start feeling better.  Follow-up with the infectious disease doctor and dermatologist listed.  Do not have intercourse or oral sex until you have completed your antibiotic course and all of your symptoms have resolved.  If you develop fever, joint pain or swelling, rash, penile or testicular pain, or any other new/concerning symptoms then return to the ER

## 2022-05-11 ENCOUNTER — Emergency Department (HOSPITAL_COMMUNITY)
Admission: EM | Admit: 2022-05-11 | Discharge: 2022-05-11 | Disposition: A | Discharge status: Home / Self Care | Payer: Commercial Managed Care - HMO | Attending: Emergency Medicine | Admitting: Emergency Medicine

## 2022-05-11 ENCOUNTER — Encounter (HOSPITAL_COMMUNITY): Payer: Self-pay | Admitting: Emergency Medicine

## 2022-05-11 ENCOUNTER — Other Ambulatory Visit: Payer: Self-pay

## 2022-05-11 DIAGNOSIS — R369 Urethral discharge, unspecified: Secondary | ICD-10-CM | POA: Diagnosis present

## 2022-05-11 DIAGNOSIS — N342 Other urethritis: Secondary | ICD-10-CM | POA: Insufficient documentation

## 2022-05-11 LAB — URINALYSIS, ROUTINE W REFLEX MICROSCOPIC
Bilirubin Urine: NEGATIVE
Glucose, UA: NEGATIVE mg/dL
Hgb urine dipstick: NEGATIVE
Ketones, ur: NEGATIVE mg/dL
Leukocytes,Ua: NEGATIVE
Nitrite: NEGATIVE
Protein, ur: NEGATIVE mg/dL
Specific Gravity, Urine: 1.023 (ref 1.005–1.030)
pH: 6 (ref 5.0–8.0)

## 2022-05-11 LAB — GC/CHLAMYDIA PROBE AMP (~~LOC~~) NOT AT ARMC
Chlamydia: NEGATIVE
Comment: NEGATIVE
Comment: NORMAL
Neisseria Gonorrhea: NEGATIVE

## 2022-05-11 NOTE — Discharge Instructions (Addendum)
Your urinalysis today was unremarkable.  Please check MyChart for results of your gonorrhea and Chlamydia testing.  If positive you will need antibiotic therapy

## 2022-05-11 NOTE — ED Provider Notes (Signed)
[facility] Provider Note   CSN: [id number] Arrival date & time: 05/11/22  0244     History  Chief Complaint  Patient presents with   [facility] TRANSMITTED DISEASE    Shawn Wells is a 32 y.o. male.  Patient presents the emergency department complaining of penile discharge and dysuria.  Patient was diagnosed with gonorrhea in December of this past year.  He was diagnosed with urethritis in January and prescribed an additional course of doxycycline in addition to the Rocephin and Flagyl he was given in the emergency department.  He states that he completed his doxycycline but states that he and his partner resumed sexual intercourse prior to the completion of antibiotic therapy.  He denies any contact with any new partners.  Past medical history significant for previous STI, TBI HPI     Home Medications Prior to Admission medications   Medication Sig Start Date End Date Taking? Authorizing Provider  acetaminophen (TYLENOL) 325 MG tablet Take 2 tablets (650 mg total) by mouth every 6 (six) hours as needed for up to 30 doses for moderate pain or mild pain. 07/21/21   Wyvonnia Dusky, MD  albuterol (VENTOLIN HFA) 108 (90 Base) MCG/ACT inhaler Inhale 1-2 puffs into the lungs every 6 (six) hours as needed for wheezing or shortness of breath. 02/27/20   Jaynee Eagles, PA-C  doxycycline (VIBRAMYCIN) 100 MG capsule Take 1 capsule (100 mg total) by mouth 2 (two) times daily. 02/11/22   Sherwood Gambler, MD      Allergies    Nickel    Review of Systems   Review of Systems  Genitourinary:  Positive for dysuria and penile discharge.    Physical Exam Updated Vital Signs BP 135/89 (BP Location: Right Arm)   Pulse 76   Temp 98.4 F (36.9 C) (Oral)   Resp 18   Ht 6\' 2"  (1.88 m)   Wt 71.7 kg   SpO2 98%   BMI 20.29 kg/m  Physical Exam Vitals and nursing note reviewed.  Constitutional:      General: He is not in acute distress.     Appearance: He is well-developed.  HENT:     Head: Normocephalic and atraumatic.  Eyes:     Conjunctiva/sclera: Conjunctivae normal.  Cardiovascular:     Rate and Rhythm: Normal rate and regular rhythm.     Heart sounds: No murmur heard. Pulmonary:     Effort: Pulmonary effort is normal. No respiratory distress.     Breath sounds: Normal breath sounds.  Abdominal:     Palpations: Abdomen is soft.     Tenderness: There is no abdominal tenderness.  Musculoskeletal:        General: No swelling.     Cervical back: Neck supple.  Skin:    General: Skin is warm and dry.     Capillary Refill: Capillary refill takes less than 2 seconds.  Neurological:     Mental Status: He is alert.  Psychiatric:        Mood and Affect: Mood normal.     ED Results / Procedures / Treatments   Labs (all labs ordered are listed, but only abnormal results are displayed) Labs Reviewed  URINALYSIS, ROUTINE W REFLEX MICROSCOPIC  GC/CHLAMYDIA PROBE AMP ([place]) NOT AT [facility]    EKG None  Radiology No results found.  Procedures Procedures    Medications Ordered in ED Medications - No data to display  ED Course/ Medical Decision Making/ A&P  Medical Decision Making  Patient presents with a chief complaint of dysuria with penile discharge.  Differential diagnosis includes but is not limited to STI, urethritis, and others  I reviewed the patient's past medical history.  The patient was seen in the emergency department in both December and January for the same complaints.  He was treated both times with Rocephin and Flagyl and prescribed an outpatient course of doxycycline.  It was also recommended due to genital warts that he follow-up with both infectious disease and dermatology.  The patient has not followed up with specialists as of this time  I ordered and reviewed labs.  Urinalysis was grossly unremarkable.  Gonorrhea, chlamydia probe pending  Based on  patient's urinalysis feel that gonorrhea/committee infection is unlikely at this time.  I considered a Rocephin injection while here at the hospital but based on the clean urinalysis we will refrain at this time.  Patient will be contacted if testing is positive for gonorrhea chlamydia so that he may get appropriate antibiotic therapy.  Discharge home at this time.        Final Clinical Impression(s) / ED Diagnoses Final diagnoses:  Urethritis    Rx / DC Orders ED Discharge Orders     None         Ronny Bacon 05/11/22 [id number]    Quintella Reichert, MD 05/11/22 [phone]

## 2022-05-11 NOTE — ED Triage Notes (Signed)
Patient was here 1 month ago and treated for gonorrhea and chlamydia with his partner.  Patient reports he did not wait the appropriate amount of time before having intercourse and his symptoms of burning with urination have returned.  Patient did complete abx course.   GCS 15

## 2022-05-29 ENCOUNTER — Ambulatory Visit
Admission: RE | Admit: 2022-05-29 | Discharge: 2022-05-29 | Disposition: A | Discharge status: Home / Self Care | Payer: Commercial Managed Care - HMO | Source: Ambulatory Visit | Attending: Urgent Care | Admitting: Urgent Care

## 2022-05-29 VITALS — BP 128/79 | HR 83 | Temp 99.3°F | Resp 18

## 2022-05-29 DIAGNOSIS — B001 Herpesviral vesicular dermatitis: Secondary | ICD-10-CM | POA: Diagnosis not present

## 2022-05-29 DIAGNOSIS — Z202 Contact with and (suspected) exposure to infections with a predominantly sexual mode of transmission: Secondary | ICD-10-CM

## 2022-05-29 NOTE — ED Triage Notes (Signed)
Pt requesting herpes test due to exposure-denies sores to penis-states he does have genital warts-NAD-steady gait

## 2022-05-29 NOTE — ED Provider Notes (Signed)
[facility] - URGENT CARE CENTER  Note:  This document was prepared using Conservation officer, historic buildings and may include unintentional dictation errors.  MRN: [id number] DOB: [birth year]  Subjective:   Shawn Wells is a 32 y.o. male presenting for HSV blood work.  Patient is coming in with his fiance who has concerns about him having herpes simplex virus.  Per his fiance, he was cheating on her and that person sent his fiance a text showing a positive herpes simplex virus culture.  As such she is coming in to get tested.  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  Denies dysuria, hematuria, urinary frequency, penile discharge, penile swelling, testicular pain, testicular swelling, anal pain, groin pain. Has consistently gotten STI testing but is trying to get tested for his information and his fiance's concerns.  Reports that he is not here for other STI testing.  No current facility-administered medications for this encounter.  Current Outpatient Medications:    acetaminophen (TYLENOL) 325 MG tablet, Take 2 tablets (650 mg total) by mouth every 6 (six) hours as needed for up to 30 doses for moderate pain or mild pain., Disp: 30 tablet, Rfl: 0   albuterol (VENTOLIN HFA) 108 (90 Base) MCG/ACT inhaler, Inhale 1-2 puffs into the lungs every 6 (six) hours as needed for wheezing or shortness of breath., Disp: 18 g, Rfl: 0   doxycycline (VIBRAMYCIN) 100 MG capsule, Take 1 capsule (100 mg total) by mouth 2 (two) times daily., Disp: 19 capsule, Rfl: 0   Allergies  Allergen Reactions   Nickel Itching    Past Medical History:  Diagnosis Date   Hypoglycemia    2012   TBI (traumatic brain injury) 08/22/2015   no defecits     Past Surgical History:  Procedure Laterality Date   CLOSED REDUCTION FINGER WITH PERCUTANEOUS PINNING Right 07/28/2021   Procedure: RIGHT 5TH CARPOMETACARPAL CLOSED  REDUCTION  WITH PERCUTANEOUS PINNING;  Surgeon: Marlyne Beards, MD;  Location: [facility];  Service: Orthopedics;  Laterality: Right;    Family History  Problem Relation Age of Onset   Other Mother        no health conditions    Other Father        no health conditions    Social History   Tobacco Use   Smoking status: Every Day    Packs/day: .25    Types: Cigarettes   Smokeless tobacco: Never  Vaping Use   Vaping Use: Never used  Substance Use Topics   Alcohol use: Yes    Comment: occasionally   Drug use: Yes    Types: Marijuana    ROS   Objective:   Vitals: BP 128/79 (BP Location: Right Arm)   Pulse 83   Temp 99.3 F (37.4 C) (Oral)   Resp 18   SpO2 98%   Physical Exam Constitutional:      General: He is not in acute distress.    Appearance: Normal appearance. He is well-developed and normal weight. He is not ill-appearing, toxic-appearing or diaphoretic.  HENT:     Head: Normocephalic and atraumatic.     Right Ear: External ear normal.     Left Ear: External ear normal.     Nose: Nose normal.     Mouth/Throat:     Pharynx: Oropharynx is clear.  Eyes:     General: No scleral icterus.  Right eye: No discharge.        Left eye: No discharge.     Extraocular Movements: Extraocular movements intact.  Cardiovascular:     Rate and Rhythm: Normal rate.  Pulmonary:     Effort: Pulmonary effort is normal.  Musculoskeletal:     Cervical back: Normal range of motion.  Neurological:     Mental Status: He is alert and oriented to person, place, and time.  Psychiatric:        Mood and Affect: Mood normal.        Behavior: Behavior normal.        Thought Content: Thought content normal.        Judgment: Judgment normal.    Assessment and Plan :   PDMP not reviewed this encounter.  1. Possible exposure to STD   2. Cold sore     Patient has a history of cold sores and I advised that he would very likely have positive HSV  antibodies.  I also counseled that this is not definitive.  Patient verbalized understanding.  He agrees that if he ever develops a genital herpes rash that he will come in to have a culture done.  Labs pending.   Wallis Bamberg, [place] 05/29/22 1436

## 2022-05-31 LAB — HSV 1 ANTIBODY, IGG: HSV 1 Glycoprotein G Ab, IgG: 62 index — ABNORMAL HIGH (ref 0.00–0.90)

## 2022-05-31 LAB — HSV-2 AB, IGG: HSV 2 IgG, Type Spec: 5.02 index — ABNORMAL HIGH (ref 0.00–0.90)

## 2022-07-27 ENCOUNTER — Other Ambulatory Visit: Payer: Self-pay

## 2022-07-27 ENCOUNTER — Encounter (HOSPITAL_COMMUNITY): Payer: Self-pay

## 2022-07-27 ENCOUNTER — Emergency Department (HOSPITAL_COMMUNITY)
Admission: EM | Admit: 2022-07-27 | Discharge: 2022-07-27 | Disposition: A | Discharge status: Home / Self Care | Payer: Commercial Managed Care - HMO | Attending: Emergency Medicine | Admitting: Emergency Medicine

## 2022-07-27 DIAGNOSIS — N4829 Other inflammatory disorders of penis: Secondary | ICD-10-CM | POA: Diagnosis present

## 2022-07-27 DIAGNOSIS — N485 Ulcer of penis: Secondary | ICD-10-CM | POA: Diagnosis not present

## 2022-07-27 LAB — RPR
RPR Ser Ql: REACTIVE — AB
RPR Titer: 1:4 {titer}

## 2022-07-27 LAB — HIV ANTIBODY (ROUTINE TESTING W REFLEX): HIV Screen 4th Generation wRfx: NONREACTIVE

## 2022-07-27 MED ORDER — CEFTRIAXONE SODIUM 500 MG IJ SOLR
250.0000 mg | Freq: Once | INTRAMUSCULAR | Status: AC
  Administered 2022-07-27: 250 mg via INTRAMUSCULAR
  Filled 2022-07-27: qty 500

## 2022-07-27 MED ORDER — STERILE WATER FOR INJECTION IJ SOLN
INTRAMUSCULAR | Status: AC
  Filled 2022-07-27: qty 10

## 2022-07-27 MED ORDER — DOXYCYCLINE HYCLATE 100 MG PO CAPS: 100.0000 mg | ORAL_CAPSULE | Freq: Two times a day (BID) | ORAL | 0 refills | Status: AC

## 2022-07-27 MED ORDER — DOXYCYCLINE HYCLATE 100 MG PO TABS
100.0000 mg | ORAL_TABLET | Freq: Once | ORAL | Status: AC
  Administered 2022-07-27: 100 mg via ORAL
  Filled 2022-07-27: qty 1

## 2022-07-27 NOTE — ED Triage Notes (Signed)
Patient reports penile swelling with blister onset yesterday , denies dysuria or fever .

## 2022-07-27 NOTE — ED Provider Notes (Signed)
[place] EMERGENCY DEPARTMENT AT [facility] Provider Note   CSN: [id number] Arrival date & time: 07/27/22  0241     History  Chief Complaint  Patient presents with   Penile Swelling/Blister    Shawn Wells is a 32 y.o. male.  HPI Patient presents for penile swelling and lesion.  Medical history includes TBI.  He was diagnosed with gonorrhea in January.  He was seen in the ED in March for dysuria and penile discharge.  GC and Chlamydia testing were negative at that time.  He was seen in urgent care 2 months ago for HSV testing following fiancs positive HSV test.  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  He was seen by [facility] urology with plans for surgical removal.  Starting 3 days ago, he noticed a lesion near the right underside of glans of his penis.  Lesion is nonpainful but is mildly tender to touch.  It has not changed in appearance over the past 3 days.  He has also noticed some right-sided penile swelling.  This is also nonpainful.  He denies any dysuria or penile discharge.  He denies any scrotal swelling or discomfort.  He denies any use of phosphodiesterase inhibitors.    Home Medications Prior to Admission medications   Medication Sig Start Date End Date Taking? Authorizing Provider  doxycycline (VIBRAMYCIN) 100 MG capsule Take 1 capsule (100 mg total) by mouth 2 (two) times daily for 14 days. 07/27/22 08/10/22 Yes Gloris Manchester, MD  acetaminophen (TYLENOL) 325 MG tablet Take 2 tablets (650 mg total) by mouth every 6 (six) hours as needed for up to 30 doses for moderate pain or mild pain. 07/21/21   Terald Sleeper, MD  albuterol (VENTOLIN HFA) 108 (90 Base) MCG/ACT inhaler Inhale 1-2 puffs into the lungs every 6 (six) hours as needed for wheezing or shortness of breath. 02/27/20   Wallis Bamberg, PA-C      Allergies    Nickel    Review of Systems   Review of Systems  Genitourinary:  Positive  for genital sores and penile swelling.  All other systems reviewed and are negative.   Physical Exam Updated Vital Signs BP 130/77 (BP Location: Right Arm)   Pulse 74   Temp 98 F (36.7 C) (Oral)   Resp 16   Ht 6\' 2"  (1.88 m)   Wt 71.8 kg   SpO2 100%   BMI 20.32 kg/m  Physical Exam Vitals and nursing note reviewed.  Constitutional:      General: He is not in acute distress.    Appearance: Normal appearance. He is well-developed. He is not ill-appearing, toxic-appearing or diaphoretic.  HENT:     Head: Normocephalic and atraumatic.     Right Ear: External ear normal.     Left Ear: External ear normal.     Nose: Nose normal.     Mouth/Throat:     Mouth: Mucous membranes are moist.  Eyes:     Extraocular Movements: Extraocular movements intact.     Conjunctiva/sclera: Conjunctivae normal.  Cardiovascular:     Rate and Rhythm: Normal rate and regular rhythm.  Pulmonary:     Effort: Pulmonary effort is normal. No respiratory distress.  Abdominal:     General: There is no distension.     Palpations: Abdomen is soft.  Genitourinary:    Comments: Condylomata are present.  A subcentimeter lesion was present on the underside distal  right penile shaft.  There is no raised margins.  Lesion is mildly tender to palpation.  Right corpora seems mildly enlarged.  There is not rigid.  It is not consistent with priapism. Musculoskeletal:        General: No swelling.     Cervical back: Normal range of motion and neck supple.     Right lower leg: No edema.     Left lower leg: No edema.  Skin:    General: Skin is warm and dry.  Neurological:     General: No focal deficit present.     Mental Status: He is alert and oriented to person, place, and time.  Psychiatric:        Mood and Affect: Mood normal.        Behavior: Behavior normal.     ED Results / Procedures / Treatments   Labs (all labs ordered are listed, but only abnormal results are displayed) Labs Reviewed  RPR  HIV  ANTIBODY (ROUTINE TESTING W REFLEX)  GC/CHLAMYDIA PROBE AMP ([place]) NOT AT [facility]    EKG None  Radiology No results found.  Procedures Procedures    Medications Ordered in ED Medications  cefTRIAXone (ROCEPHIN) injection 250 mg (250 mg Intramuscular Given 07/27/22 0540)  doxycycline (VIBRA-TABS) tablet 100 mg (100 mg Oral Given 07/27/22 0540)  sterile water (preservative free) injection ( Injection Given 07/27/22 1610)    ED Course/ Medical Decision Making/ A&P                             Medical Decision Making Amount and/or Complexity of Data Reviewed Labs: ordered.  Risk Prescription drug management.   Patient presents for penile swelling and lesion.  Exam was performed with chaperone present.  Patient has nonpainful but mildly tender lesion on underside of right distal penile shaft.  Differential diagnoses include chancroid, syphilis, and lymphogranuloma venereum.  He does have mild bilateral inguinal adenopathy.  Empiric treatment was ordered with intramuscular ceftriaxone in addition to doxycycline.  Right corpora seems mildly enlarged but findings not consistent with priapism.  He has condylomata which are longstanding and he is followed by urology at [facility] for this.  Patient was advised to discuss right-sided penile swelling with urology at follow-up.  Patient is agreeable to broad STI testing.  This was ordered.  Patient was advised to follow-up on results.  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  This will also cover for syphilis.  He was discharged in stable condition.         Final Clinical Impression(s) / ED Diagnoses Final diagnoses:  Penile ulcer    Rx / DC Orders ED Discharge Orders          Ordered    doxycycline (VIBRAMYCIN) 100 MG capsule  2 times daily        07/27/22 0641              Gloris Manchester, MD 07/27/22 [phone]

## 2022-07-27 NOTE — Discharge Instructions (Signed)
Follow-up on results of testing done today in the emergency department through MyChart or when you follow-up with your urologist.  Discuss the right-sided swelling with your urologist.  You should call them to set up an appointment as soon as possible.  A prescription for antibiotics was sent to your pharmacy.  Take as prescribed.  Return to the emergency department for any new or worsening symptoms of concern.

## 2022-07-28 LAB — T.PALLIDUM AB, TOTAL: T Pallidum Abs: REACTIVE — AB

## 2022-10-16 ENCOUNTER — Other Ambulatory Visit: Payer: Self-pay

## 2022-10-16 ENCOUNTER — Emergency Department (HOSPITAL_COMMUNITY): Payer: Commercial Managed Care - HMO

## 2022-10-16 ENCOUNTER — Emergency Department (HOSPITAL_COMMUNITY)
Admission: EM | Admit: 2022-10-16 | Discharge: 2022-10-16 | Disposition: A | Discharge status: Home / Self Care | Payer: Commercial Managed Care - HMO | Attending: Emergency Medicine | Admitting: Emergency Medicine

## 2022-10-16 ENCOUNTER — Encounter (HOSPITAL_COMMUNITY): Payer: Self-pay

## 2022-10-16 DIAGNOSIS — M542 Cervicalgia: Secondary | ICD-10-CM | POA: Diagnosis present

## 2022-10-16 DIAGNOSIS — M436 Torticollis: Secondary | ICD-10-CM | POA: Insufficient documentation

## 2022-10-16 MED ORDER — CYCLOBENZAPRINE HCL 10 MG PO TABS: 10.0000 mg | ORAL_TABLET | Freq: Two times a day (BID) | ORAL | 0 refills | Status: AC | PRN

## 2022-10-16 MED ORDER — IBUPROFEN 400 MG PO TABS
400.0000 mg | ORAL_TABLET | Freq: Once | ORAL | Status: AC
  Administered 2022-10-16: 400 mg via ORAL
  Filled 2022-10-16: qty 1

## 2022-10-16 MED ORDER — ACETAMINOPHEN 500 MG PO TABS
1000.0000 mg | ORAL_TABLET | Freq: Once | ORAL | Status: AC
  Administered 2022-10-16: 1000 mg via ORAL
  Filled 2022-10-16: qty 2

## 2022-10-16 NOTE — ED Triage Notes (Signed)
Pt arrived POV from home stating that he woke up Saturday with neck pain. It hurts to move his head, to tilt his head forward and raise his head backwards. Pt states it is not painful to swallow but it does feel weird and it also hurts to lift his left arm.

## 2022-10-16 NOTE — ED Notes (Signed)
..  The patient is A&OX4, ambulatory at d/c with independent steady gait, NAD. Pt verbalized understanding of d/c instructions, prescription and follow up care.  

## 2022-10-16 NOTE — Discharge Instructions (Signed)
X-rays looked okay.  This is most likely a muscle spasm.  You can take 2 Tylenol and 2 ibuprofen together every 6 hours as needed for pain.  You can also use muscle rub and heating pad to the area.  You were given a prescription for a muscle relaxer that you can use if the other things are not helping.  It could cause drowsiness and do not take it before you go to work.  If you start having weakness in your arm or swelling you should return to the emergency room.

## 2022-10-16 NOTE — ED Provider Notes (Signed)
[place] EMERGENCY DEPARTMENT AT [facility] Provider Note   CSN: [id number] Arrival date & time: 10/16/22  1725     History {Add pertinent medical, surgical, social history, OB history to HPI:1} Chief Complaint  Patient presents with   Neck Pain    [place] Wells is a 32 y.o. male.  Patient is a 32 year old male with a prior TBI without deficits who is presenting today with complaint of right-sided neck pain that started yesterday.  He does do a physical job loading trucks for away [employer] but denies any known injury.  He reports the pain is worse if he moves his neck a certain way, lifts his left arm over his head and twists.  He also feels the pain in the back of his neck when he swallows but denies any anterior neck pain pain in his throat, fevers.  No syncope, numbness radiating down his arms, pain in his lower back or legs.  He tried a muscle rub on the area and did not feel that it was helpful.  He has not tried taking any oral medications.  The history is provided by the patient.  Neck Pain      Home Medications Prior to Admission medications   Medication Sig Start Date End Date Taking? Authorizing Provider  acetaminophen (TYLENOL) 325 MG tablet Take 2 tablets (650 mg total) by mouth every 6 (six) hours as needed for up to 30 doses for moderate pain or mild pain. 07/21/21   Terald Sleeper, MD  albuterol (VENTOLIN HFA) 108 (90 Base) MCG/ACT inhaler Inhale 1-2 puffs into the lungs every 6 (six) hours as needed for wheezing or shortness of breath. 02/27/20   Wallis Bamberg, PA-C      Allergies    Nickel    Review of Systems   Review of Systems  Musculoskeletal:  Positive for neck pain.    Physical Exam Updated Vital Signs BP 129/79 (BP Location: Right Arm)   Pulse 66   Temp 98.4 F (36.9 C) (Oral)   Resp 14   Ht 6\' 2"  (1.88 m)   Wt 71.7 kg   SpO2 96%   BMI 20.29 kg/m  Physical Exam Vitals and nursing note reviewed.  Constitutional:      General: He  is not in acute distress.    Appearance: He is well-developed.  HENT:     Head: Normocephalic and atraumatic.     Right Ear: Tympanic membrane normal.     Left Ear: Tympanic membrane normal.     Nose: Nose normal.     Mouth/Throat:     Mouth: Mucous membranes are moist.     Pharynx: No oropharyngeal exudate or posterior oropharyngeal erythema.  Eyes:     Conjunctiva/sclera: Conjunctivae normal.     Pupils: Pupils are equal, round, and reactive to light.  Neck:      Comments: Muscle pain and spasm palpable Cardiovascular:     Rate and Rhythm: Normal rate and regular rhythm.     Pulses: Normal pulses.     Heart sounds: No murmur heard. Pulmonary:     Effort: Pulmonary effort is normal. No respiratory distress.     Breath sounds: Normal breath sounds. No wheezing or rales.  Musculoskeletal:        General: No tenderness. Normal range of motion.     Cervical back: Normal range of motion and neck supple. Muscular tenderness present. No spinous process tenderness.  Skin:    General: Skin is warm and dry.  Findings: No erythema or rash.  Neurological:     Mental Status: He is alert and oriented to person, place, and time.     Sensory: No sensory deficit.     Motor: No weakness.     Comments: Normal strength and sensation of bilateral upper extremities.  2+ radial pulses bilaterally  Psychiatric:        Behavior: Behavior normal.     ED Results / Procedures / Treatments   Labs (all labs ordered are listed, but only abnormal results are displayed) Labs Reviewed - No data to display  EKG None  Radiology No results found.  Procedures Procedures  {Document cardiac monitor, telemetry assessment procedure when appropriate:1}  Medications Ordered in ED Medications  acetaminophen (TYLENOL) tablet 1,000 mg (1,000 mg Oral Given 10/16/22 1935)  ibuprofen (ADVIL) tablet 400 mg (400 mg Oral Given 10/16/22 1935)    ED Course/ Medical Decision Making/ A&P   {   Click here for  ABCD2, HEART and other calculatorsREFRESH Note before signing :1}                              Medical Decision Making Amount and/or Complexity of Data Reviewed Radiology: ordered.  Risk OTC drugs. Prescription drug management.   Patient presenting today with complaint of posterior right neck pain most classic for torticollis and muscle spasm.  Neurovascularly intact with low suspicion for upper extremity DVT or radiculopathy.  Patient denies any trauma but does do a lot of lifting at work.  Patient given Tylenol and ibuprofen.  He has no anterior neck symptoms, ENT exam is benign.  {Document critical care time when appropriate:1} {Document review of labs and clinical decision tools ie heart score, Chads2Vasc2 etc:1}  {Document your independent review of radiology images, and any outside records:1} {Document your discussion with family members, caretakers, and with consultants:1} {Document social determinants of health affecting pt's care:1} {Document your decision making why or why not admission, treatments were needed:1} Final Clinical Impression(s) / ED Diagnoses Final diagnoses:  None    Rx / DC Orders ED Discharge Orders     None

## 2023-04-09 ENCOUNTER — Emergency Department (HOSPITAL_COMMUNITY)
Admission: EM | Admit: 2023-04-09 | Discharge: 2023-04-09 | Disposition: A | Discharge status: Home / Self Care | Payer: Commercial Managed Care - HMO | Attending: Emergency Medicine | Admitting: Emergency Medicine

## 2023-04-09 ENCOUNTER — Encounter (HOSPITAL_COMMUNITY): Payer: Self-pay

## 2023-04-09 ENCOUNTER — Other Ambulatory Visit: Payer: Self-pay

## 2023-04-09 DIAGNOSIS — L240 Irritant contact dermatitis due to detergents: Secondary | ICD-10-CM | POA: Diagnosis present

## 2023-04-09 DIAGNOSIS — Z79899 Other long term (current) drug therapy: Secondary | ICD-10-CM | POA: Diagnosis not present

## 2023-04-09 DIAGNOSIS — A630 Anogenital (venereal) warts: Secondary | ICD-10-CM | POA: Insufficient documentation

## 2023-04-09 NOTE — Discharge Instructions (Addendum)
 It was a pleasure caring for you today.  As discussed, you could possibly get some steroid cream over-the-counter at your local drugstore -however this area should resolve appropriately after you stop the offending agent.  Please follow-up with your primary care provider.  Seek emergency care if experiencing any new or worsening symptoms.

## 2023-04-09 NOTE — ED Provider Notes (Signed)
 [place] EMERGENCY DEPARTMENT AT [facility] Provider Note   CSN: [id number] Arrival date & time: 04/09/23  1203     History  No chief complaint on file.   Shawn Wells is a 33 y.o. male with PMHx TBI, penile condyloma acuminatum who presents to ED concerned for skin irritation. Patient stating that his skin is very sensitive, and he used a new (non-sensitive) soap yesterday. Patient stating that this soap irritated his chronic genital warts and they started bleeding a little bit and they feel irritated. Patient has not used the soap since yesterday.  HPI     Home Medications Prior to Admission medications   Medication Sig Start Date End Date Taking? Authorizing Provider  acetaminophen (TYLENOL) 325 MG tablet Take 2 tablets (650 mg total) by mouth every 6 (six) hours as needed for up to 30 doses for moderate pain or mild pain. 07/21/21   Terald Sleeper, MD  albuterol (VENTOLIN HFA) 108 (90 Base) MCG/ACT inhaler Inhale 1-2 puffs into the lungs every 6 (six) hours as needed for wheezing or shortness of breath. 02/27/20   Wallis Bamberg, PA-C  cyclobenzaprine (FLEXERIL) 10 MG tablet Take 1 tablet (10 mg total) by mouth 2 (two) times daily as needed for muscle spasms. 10/16/22   Gwyneth Sprout, MD      Allergies    Nickel    Review of Systems   Review of Systems  Skin:  Positive for rash.    Physical Exam Updated Vital Signs BP 120/82   Pulse 79   Temp 98.2 F (36.8 C)   Resp 17   SpO2 100%  Physical Exam Vitals and nursing note reviewed.  Constitutional:      General: He is not in acute distress.    Appearance: He is not ill-appearing or toxic-appearing.  HENT:     Head: Normocephalic and atraumatic.  Eyes:     General: No scleral icterus.       Right eye: No discharge.        Left eye: No discharge.     Conjunctiva/sclera: Conjunctivae normal.  Cardiovascular:     Rate and Rhythm: Normal rate.  Pulmonary:     Effort: Pulmonary effort is normal.   Abdominal:     General: Abdomen is flat.  Genitourinary:    Comments: Nurse Alaina present for penile exam. Abundant genital warts appreciated. <0.5cm area of irritation/mild erythema noticed on one of the genital warts. No swelling or spreading erythema. No purulence. No penile discharge. Skin:    General: Skin is warm and dry.  Neurological:     General: No focal deficit present.     Mental Status: He is alert and oriented to person, place, and time. Mental status is at baseline.  Psychiatric:        Mood and Affect: Mood normal.        Behavior: Behavior normal.     ED Results / Procedures / Treatments   Labs (all labs ordered are listed, but only abnormal results are displayed) Labs Reviewed - No data to display  EKG None  Radiology No results found.  Procedures Procedures    Medications Ordered in ED Medications - No data to display  ED Course/ Medical Decision Making/ A&P                                 Medical Decision Making  This patient presents to the ED  for concern of rash, this involves an extensive number of treatment options, and is a complaint that carries with it a high risk of complications and morbidity.  The differential diagnosis includes irritant contact dermatitis, DRESS, atopic dermatitis, anaphylaxis, SJS/TEN   Co morbidities that complicate the patient evaluation  Genital warts, TBI   Additional history obtained:  Dr. Tiburcio Pea PCP   Problem List / ED Course / Critical interventions / Medication management  Patient presents to ED concern for irritation of his genital warts.  Patient stating that he has very sensitive skin and is only able to use his sensitive Dove soap.  Patient used an nonsensitive soap yesterday.  Patient noticing irritation of the genital warts and stated that they started bleeding a little bit earlier today.  Physical exam reassuring.  Patient afebrile with stable vitals.  Patient denies any other infectious symptoms  today. Appears the patient is suffering from very mild case of contact/irritant dermatitis.  Educated patient to cease irritant exposure and keep the area clean.  Area does not look infected today - I do not think that antibiotics are necessary.  Recommended following up with PCP.  Patient verbalized understanding of plan. I have reviewed the patients home medicines and have made adjustments as needed  Ddx: these are considered less likely due to history of present illness and physical exam -DRESS: patient afebrile, stable vitals  -atopic dermatitis: rash not confined to flexural areas   Social Determinants of Health:  none         Final Clinical Impression(s) / ED Diagnoses Final diagnoses:  Irritant contact dermatitis due to detergent    Rx / DC Orders ED Discharge Orders     None         Margarita Rana 04/09/23 1843    Laurence Spates, MD 04/09/23 2133

## 2023-04-09 NOTE — ED Notes (Signed)
 Pt verbalized understanding of discharge instructions. Opportunity for questions provided.

## 2023-04-09 NOTE — ED Triage Notes (Signed)
 Patient complains of using new soap and showered last night and complaining of genital irritation. Reports tingling to same

## 2023-08-15 ENCOUNTER — Encounter (HOSPITAL_COMMUNITY): Payer: Self-pay | Admitting: Emergency Medicine

## 2023-08-15 ENCOUNTER — Emergency Department (HOSPITAL_COMMUNITY): Payer: Self-pay

## 2023-08-15 ENCOUNTER — Other Ambulatory Visit: Payer: Self-pay

## 2023-08-15 ENCOUNTER — Emergency Department (HOSPITAL_COMMUNITY)
Admission: EM | Admit: 2023-08-15 | Discharge: 2023-08-15 | Disposition: A | Discharge status: Home / Self Care | Payer: Self-pay | Attending: Emergency Medicine | Admitting: Emergency Medicine

## 2023-08-15 DIAGNOSIS — A630 Anogenital (venereal) warts: Secondary | ICD-10-CM | POA: Insufficient documentation

## 2023-08-15 DIAGNOSIS — N489 Disorder of penis, unspecified: Secondary | ICD-10-CM | POA: Insufficient documentation

## 2023-08-15 LAB — HIV ANTIBODY (ROUTINE TESTING W REFLEX): HIV Screen 4th Generation wRfx: NONREACTIVE

## 2023-08-15 MED ORDER — DOXYCYCLINE HYCLATE 100 MG PO CAPS
100.0000 mg | ORAL_CAPSULE | Freq: Two times a day (BID) | ORAL | 0 refills | Status: AC
Start: 2023-08-15 — End: 2023-08-29

## 2023-08-15 NOTE — ED Triage Notes (Signed)
 PT complains of testicle irritation: denies any swelling, redness or drainage x 2 days. Did use a different soap the other day thought it may be the cause. Notices some skin peeling today. Also complains of a lump in left groin that has been there for other awhile. Pt states was medicated for lump in past and it decreased in size but has never gone away. Has appeared to be getting bigger for past month. Denies any pain.

## 2023-08-15 NOTE — ED Provider Notes (Signed)
 [place] EMERGENCY DEPARTMENT AT [facility] Provider Note   CSN: [id number] Arrival date & time: 08/15/23  9284     Patient presents with: Testicle Pain   Shawn Wells is a 33 y.o. male.    Testicle Pain  Patient is a 33 year old male with past medical history significant for condyloma acuminata of scrotum and testicles.  He has chronic condyloma lesions that he follows with [facility] urology for.  They have recommended surgical removal but he has been unable to afford this.  Patient states that on the left side of the shaft of his penis he has a wound that has been present a whileand on further questioning seems that this is the area he was seen in the emergency department 6/12 for.       Prior to Admission medications   Medication Sig Start Date End Date Taking? Authorizing Provider  doxycycline  (VIBRAMYCIN ) 100 MG capsule Take 1 capsule (100 mg total) by mouth 2 (two) times daily for 14 days. 08/15/23 08/29/23 Yes Sidi Dzikowski S, PA  acetaminophen  (TYLENOL ) 325 MG tablet Take 2 tablets (650 mg total) by mouth every 6 (six) hours as needed for up to 30 doses for moderate pain or mild pain. 07/21/21   Cottie Donnice PARAS, MD  albuterol  (VENTOLIN  HFA) 108 (90 Base) MCG/ACT inhaler Inhale 1-2 puffs into the lungs every 6 (six) hours as needed for wheezing or shortness of breath. 02/27/20   Christopher Savannah, PA-C  cyclobenzaprine  (FLEXERIL ) 10 MG tablet Take 1 tablet (10 mg total) by mouth 2 (two) times daily as needed for muscle spasms. 10/16/22   Doretha Folks, MD    Allergies: Nickel    Review of Systems  Genitourinary:  Positive for testicular pain.    Updated Vital Signs BP 128/87 (BP Location: Right Arm)   Pulse (!) 57   Temp 98.3 F (36.8 C) (Oral)   Resp 17   SpO2 100%   Physical Exam Vitals and nursing note reviewed.  Constitutional:      General: He is not in acute distress. HENT:     Head: Normocephalic and atraumatic.     Nose:  Nose normal.   Eyes:     General: No scleral icterus.  Pulmonary:     Effort: Pulmonary effort is normal.  Abdominal:     Palpations: Abdomen is soft.     Tenderness: There is no abdominal tenderness.  Genitourinary:    Comments: GU exam is notable for multiple condylomatous lesions to bilateral proximal shaft of penis.  On left proximal shaft of penis there is a circular approximately 1.2 cm shallow ulcer.  No discharge or purulence from this area. No evidence of penile discharge from urethra.  Scrotum with approximately 3 mm circular shallow ulcer that appears to be well-healing.  Testes with normal lie  Notable inguinal lymphadenopathy left greater than right.  Musculoskeletal:     Cervical back: Normal range of motion.     Right lower leg: No edema.     Left lower leg: No edema.   Skin:    General: Skin is warm and dry.     Capillary Refill: Capillary refill takes less than 2 seconds.   Neurological:     Mental Status: He is alert. Mental status is at baseline.   Psychiatric:        Mood and Affect: Mood normal.        Behavior: Behavior normal.     (all labs ordered are listed,  but only abnormal results are displayed) Labs Reviewed  HIV ANTIBODY (ROUTINE TESTING W REFLEX)  RPR  GC/CHLAMYDIA PROBE AMP ([place]) NOT AT [facility]    EKG: None  Radiology: No results found.   Procedures   Medications Ordered in the ED - No data to display                                  Medical Decision Making Amount and/or Complexity of Data Reviewed Labs: ordered.  Risk Prescription drug management.   Patient is a 33 year old male with past medical history significant for condyloma acuminata of scrotum and testicles.  He has chronic condyloma lesions that he follows with [facility] urology for.  They have recommended surgical removal but he has been unable to afford this.  Patient states that on the left side of the shaft of his penis he has a  wound that has been present a whileand on further questioning seems that this is the area he was seen in the emergency department 6/12 for.  My exam is notable for a shallow 1.2 cm ulcer on the left proximal shaft of the penis.  Patient states that this is the same lesion that was present previously however it is not described as same place on prior ER visit exam.  Patient did have positive RPR with positive reflex T. pallidum antibody result. 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  He also followed up with urology was recommended surgery for removal of condylomatous warts.  He is unable to go through with this treatment however because of cost.  Update today will obtain gonorrhea chlamydia HIV RPR and treat his syphilis-like lesion with doxycycline  twice daily for 2 weeks.  He will follow-up with [facility] urology in the meantime. Lymphogranuloma venereum also certainly possible--this would require a more extended doxycycline  treatment for 21 days.  He states he is able to follow-up with urology within the next 2 weeks.  He understands the necessity of close follow-up and to return the emergency room for any new or concerning symptoms.  Counseled sexual abstinence in the meantime while he is being treated and until he is cleared by urology.  Final diagnoses:  Lesion of penis  Condyloma acuminata    ED Discharge Orders          Ordered    doxycycline  (VIBRAMYCIN ) 100 MG capsule  2 times daily        08/15/23 0942               Neldon Hamp RAMAN, PA 08/15/23 1320    Freddi Hamilton, MD 08/16/23 1022

## 2023-08-15 NOTE — Discharge Instructions (Addendum)
 I have concern for a skin infection that is causing the swelling in your groin and the lesion to your penis.  Please take the antibiotic as prescribed for the entire course.  Please follow-up with your urologist at [facility]. Please return to the emergency room for any new or concerning symptoms.  Please refrain from sex until you are cleared by urology at [facility].  I have printed some information about genital warts for you.   If you develop any discharge from your penis or any testicular pain please return to the emergency room for reevaluation.  Please take all of your antibiotics until finished!   You may develop abdominal discomfort or diarrhea from the antibiotic.  You may help offset this with probiotics which you can buy or get in yogurt. Do not eat  or take the probiotics until 2 hours after your antibiotic.

## 2023-08-15 NOTE — ED Notes (Signed)
 Patient transported to Ultrasound

## 2023-08-15 NOTE — ED Notes (Signed)
 Pt verbalizes understanding of discharge instructions. Opportunity for questioning and answers were provided. Pt discharged form ED

## 2023-08-16 LAB — RPR
RPR Ser Ql: REACTIVE — AB
RPR Titer: 1:64 {titer}

## 2023-08-17 LAB — GC/CHLAMYDIA PROBE AMP (~~LOC~~) NOT AT ARMC
Chlamydia: NEGATIVE
Comment: NEGATIVE
Comment: NORMAL
Neisseria Gonorrhea: NEGATIVE

## 2023-08-17 LAB — T.PALLIDUM AB, TOTAL: T Pallidum Abs: REACTIVE — AB
